# Patient Record
Sex: Male | Born: 1960
Health system: Southern US, Community
[De-identification: ages and names within clinical notes are randomized; demographics above are authoritative.]

## PROBLEM LIST (undated history)

## (undated) DIAGNOSIS — M1990 Unspecified osteoarthritis, unspecified site: Secondary | ICD-10-CM

## (undated) DIAGNOSIS — F1910 Other psychoactive substance abuse, uncomplicated: Secondary | ICD-10-CM

## (undated) DIAGNOSIS — F411 Generalized anxiety disorder: Secondary | ICD-10-CM

## (undated) DIAGNOSIS — F17200 Nicotine dependence, unspecified, uncomplicated: Secondary | ICD-10-CM

## (undated) DIAGNOSIS — F1110 Opioid abuse, uncomplicated: Secondary | ICD-10-CM

## (undated) DIAGNOSIS — L409 Psoriasis, unspecified: Secondary | ICD-10-CM

## (undated) DIAGNOSIS — Z87442 Personal history of urinary calculi: Secondary | ICD-10-CM

## (undated) DIAGNOSIS — G629 Polyneuropathy, unspecified: Secondary | ICD-10-CM

## (undated) DIAGNOSIS — R892 Abnormal level of other drugs, medicaments and biological substances in specimens from other organs, systems and tissues: Secondary | ICD-10-CM

## (undated) DIAGNOSIS — T7840XA Allergy, unspecified, initial encounter: Secondary | ICD-10-CM

## (undated) HISTORY — DX: Generalized anxiety disorder: F41.1

## (undated) HISTORY — DX: Allergy, unspecified, initial encounter: T78.40XA

## (undated) HISTORY — DX: Nicotine dependence, unspecified, uncomplicated: F17.200

## (undated) HISTORY — DX: Abnormal level of other drugs, medicaments and biological substances in specimens from other organs, systems and tissues: R89.2

## (undated) HISTORY — DX: Unspecified osteoarthritis, unspecified site: M19.90

## (undated) HISTORY — DX: Other psychoactive substance abuse, uncomplicated: F19.10

## (undated) HISTORY — DX: Personal history of urinary calculi: Z87.442

## (undated) HISTORY — DX: Psoriasis, unspecified: L40.9

## (undated) HISTORY — DX: Opioid abuse, uncomplicated: F11.10

---

## 1998-06-18 DIAGNOSIS — F17200 Nicotine dependence, unspecified, uncomplicated: Secondary | ICD-10-CM

## 1998-06-18 HISTORY — DX: Nicotine dependence, unspecified, uncomplicated: F17.200

## 2002-09-17 ENCOUNTER — Emergency Department (HOSPITAL_COMMUNITY): Admission: EM | Admit: 2002-09-17 | Discharge: 2002-09-17 | Payer: Self-pay | Admitting: Emergency Medicine

## 2002-09-17 ENCOUNTER — Encounter: Payer: Self-pay | Admitting: Internal Medicine

## 2002-09-21 ENCOUNTER — Ambulatory Visit (HOSPITAL_BASED_OUTPATIENT_CLINIC_OR_DEPARTMENT_OTHER): Admission: RE | Admit: 2002-09-21 | Discharge: 2002-09-21 | Payer: Self-pay | Admitting: Urology

## 2002-09-21 ENCOUNTER — Encounter: Payer: Self-pay | Admitting: Urology

## 2004-04-18 HISTORY — PX: SHOULDER SURGERY: SHX246

## 2004-05-08 ENCOUNTER — Ambulatory Visit: Payer: Self-pay | Admitting: Family Medicine

## 2004-06-18 HISTORY — PX: KIDNEY STONE SURGERY: SHX686

## 2008-06-18 HISTORY — PX: KNEE SURGERY: SHX244

## 2008-09-14 ENCOUNTER — Emergency Department (HOSPITAL_COMMUNITY): Admission: EM | Admit: 2008-09-14 | Discharge: 2008-09-14 | Payer: Self-pay | Admitting: Emergency Medicine

## 2009-04-01 ENCOUNTER — Ambulatory Visit: Payer: Self-pay | Admitting: Unknown Physician Specialty

## 2009-04-04 ENCOUNTER — Ambulatory Visit: Payer: Self-pay | Admitting: Unknown Physician Specialty

## 2010-09-28 LAB — COMPREHENSIVE METABOLIC PANEL
AST: 13 U/L (ref 0–37)
Albumin: 4.2 g/dL (ref 3.5–5.2)
Alkaline Phosphatase: 71 U/L (ref 39–117)
BUN: 11 mg/dL (ref 6–23)
CO2: 33 mEq/L — ABNORMAL HIGH (ref 19–32)
Chloride: 103 mEq/L (ref 96–112)
GFR calc Af Amer: >60 mL/min (ref >60)
GFR calc non Af Amer: >60 mL/min (ref >60)
Potassium: 4.5 mEq/L (ref 3.5–5.1)
Total Bilirubin: 0.7 mg/dL (ref 0.3–1.2)

## 2010-09-28 LAB — URINALYSIS, ROUTINE W REFLEX MICROSCOPIC
Nitrite: NEGATIVE
Specific Gravity, Urine: 1.017 (ref 1.005–1.030)
Urobilinogen, UA: 1 mg/dL (ref 0.0–1.0)

## 2010-09-28 LAB — DIFFERENTIAL
Basophils Absolute: 0.1 10*3/uL (ref 0.0–0.1)
Lymphocytes Relative: 45 % (ref 12–46)
Lymphs Abs: 3 10*3/uL (ref 0.7–4.0)
Neutro Abs: 3 10*3/uL (ref 1.7–7.7)

## 2010-09-28 LAB — CBC
HCT: 46.9 % (ref 39.0–52.0)
Platelets: 244 10*3/uL (ref 150–400)
RBC: 4.95 MIL/uL (ref 4.22–5.81)
WBC: 6.7 10*3/uL (ref 4.0–10.5)

## 2010-09-28 LAB — LIPASE, BLOOD: Lipase: 33 U/L (ref 11–59)

## 2010-11-03 NOTE — Op Note (Signed)
Jason Rodgers, Jason Rodgers                             ACCOUNT NO.:  [id number]   MEDICAL RECORD NO.:  [id number]                   PATIENT TYPE:  AMB   LOCATION:  [facility]                                 FACILITY:  [facility]   PHYSICIAN:  Mark C. Vernie Ammons, M.D.               DATE OF BIRTH:  1960/10/20   DATE OF PROCEDURE:  09/21/2002  DATE OF DISCHARGE:                                 OPERATIVE REPORT   PREOPERATIVE DIAGNOSIS:  Left ureteral calculus.   POSTOPERATIVE DIAGNOSIS:  Left ureteral calculus.   PROCEDURE:  Cystoscopy, left retrograde pyelogram with interpretation, left  ureteroscopy with in situ laser lithotripsy and stone extraction with double  J stent placement.   SURGEON:  Mark C. Vernie Ammons, M.D.   ANESTHESIA:  General.   ESTIMATED BLOOD LOSS:  Less than 5 mL.   DRAINS:  4.5 French 24 cm double J stent in the left ureter.   SPECIMENS:  Stone to patient.   COMPLICATIONS:  None.   INDICATIONS FOR PROCEDURE:  The patient is a 50 year old white male who  presented to my office on September 17, 2002 with severe left flank pain and  gross hematuria. A CT scan revealed a stone in the left ureter that measured  3 x 5 mm. He wanted to give it a little bit of time to try to pass but it  did not and he therefore elected to proceed with ureteroscopic extraction of  the stone. The risks and complications were discussed.   DESCRIPTION OF PROCEDURE:  After informed consent, the patient was brought  to the major OR, placed on the table, administered general anesthesia. A KUB  done today revealed the stone approximately 2-3 cm below where it was before  but still in the mid to lower ureter. The 21 French cystoscope was then used  to perform cystoscopy. The urethra was normal down to the sphincter. The  prostatic urethra was unobstructing without lesion. The bladder was free of  any tumor, stones or inflammatory lesions.   The left ureteral orifice was identified and a 0.038 inch floppy tip  guidewire was then passed up to the left ureter under direct fluoroscopic  control. There was some hold up in the area of the stone but was able to get  it past the stone and into the area of the kidney. I then used the 4 cm  length 15 French ureteral dilating balloon to dilate the balloon to 11  atmospheres for approximately 1 minute. I then removed the dilating balloon  and left the guidewire in place and passed the ureteroscope next to the  guidewire up the ureter and then visualized the stone. It seemed somewhat  impacted.  I therefore used the laser to fragment the stone. The stone  fragments then were grasped with the nitinol basket and extracted.  Reinspection revealed no further stone fragments present. The retrograde  pyelogram  revealed no stones proximally or filling defects. I therefore  removed the ureteroscope and back loaded the cystoscope over the guidewire.  The double J stent was passed over the guidewire, the guidewire was removed,  good coil was noted in the renal pelvis and in the bladder. A string was  left affixed to the stent and the bladder was  drained. The patient received a B&O suppository and was awakened and taken  to the recovery room in stable satisfactory condition. He tolerated the  procedure well with no intraoperative complications. He will be given a  prescription for Pyridium plus and return to my office the latter part of  the week for his stent removal.                                               Mark C. Vernie Ammons, M.D.    MCO/MEDQ  D:  09/21/2002  T:  09/21/2002  Job:  [id number]

## 2011-04-03 ENCOUNTER — Ambulatory Visit: Payer: Self-pay | Admitting: Internal Medicine

## 2011-05-14 ENCOUNTER — Ambulatory Visit: Payer: Self-pay | Admitting: Internal Medicine

## 2013-02-06 ENCOUNTER — Encounter (HOSPITAL_COMMUNITY): Payer: Self-pay | Admitting: *Deleted

## 2013-02-06 ENCOUNTER — Emergency Department (HOSPITAL_COMMUNITY)
Admission: EM | Admit: 2013-02-06 | Discharge: 2013-02-06 | Disposition: A | Discharge status: Home / Self Care | Payer: Medicare HMO | Attending: Emergency Medicine | Admitting: Emergency Medicine

## 2013-02-06 DIAGNOSIS — Z87442 Personal history of urinary calculi: Secondary | ICD-10-CM | POA: Insufficient documentation

## 2013-02-06 DIAGNOSIS — M25569 Pain in unspecified knee: Secondary | ICD-10-CM | POA: Insufficient documentation

## 2013-02-06 DIAGNOSIS — F17200 Nicotine dependence, unspecified, uncomplicated: Secondary | ICD-10-CM | POA: Insufficient documentation

## 2013-02-06 DIAGNOSIS — G8929 Other chronic pain: Secondary | ICD-10-CM | POA: Insufficient documentation

## 2013-02-06 DIAGNOSIS — Z8739 Personal history of other diseases of the musculoskeletal system and connective tissue: Secondary | ICD-10-CM | POA: Insufficient documentation

## 2013-02-06 MED ORDER — CELECOXIB 200 MG PO CAPS: 200.0000 mg | ORAL_CAPSULE | Freq: Two times a day (BID) | ORAL | Status: DC

## 2013-02-06 MED ORDER — OXYCODONE-ACETAMINOPHEN 5-325 MG PO TABS: 1.0000 | ORAL_TABLET | ORAL | Status: DC | PRN

## 2013-02-06 NOTE — ED Provider Notes (Signed)
CSN: [id number]     Arrival date & time 02/06/13  7829 History     First MD Initiated Contact with Patient 02/06/13 [phone]     Chief Complaint  Patient presents with  . Leg Pain   (Consider location/radiation/quality/duration/timing/severity/associated sxs/prior Treatment) HPI Patient is a 52 year old male who came into the emergency department today complaining of a chronic bilateral knee pain. Patient states he has had pain for "many years." States had surgery on his left knee where they "cleaned the cartilage out." Patient states he has severe arthritis in bilateral knees and states he has "cartilage left." He is followed by a physician and had surgery at [facility]. States just lost his primary care doctor who has moved in does not have a followup any longer. States his doctor had him on Celebrex, Xanax, oxycodone 30 mg. States he is out of those medications. States his pain in his knees and is worsening. Patient states he is unable to do ice or heat because it makes his pain worse. She states he has pain with walking, moving his knees, and unable to sleep due to pain. Patient states pain radiates to bilateral feet. Patient denies any swelling of the lower extremities or knee joints. No fever, chills, malaise.  denies any new injury Past Medical History  Diagnosis Date  . Kidney stones    Past Surgical History  Procedure Laterality Date  . Knee surgery     No family history on file. History  Substance Use Topics  . Smoking status: Current Every Day Smoker -- 0.50 packs/day    Types: Cigarettes  . Smokeless tobacco: Not on file  . Alcohol Use: No    Review of Systems  Constitutional: Negative for fever and chills.  HENT: Negative for neck pain and neck stiffness.   Respiratory: Negative for cough, chest tightness and shortness of breath.   Cardiovascular: Negative for chest pain, palpitations and leg swelling.  Genitourinary: Negative for urgency.  Musculoskeletal:  Positive for myalgias and arthralgias. Negative for joint swelling.  Skin: Negative for rash.  Allergic/Immunologic: Negative for immunocompromised state.  Neurological: Negative for dizziness, weakness, light-headedness, numbness and headaches.    Allergies  Review of patient's allergies indicates no known allergies.  Home Medications  No current outpatient prescriptions on file. BP 142/91  Pulse 84  Temp(Src) 99.5 F (37.5 C)  Resp 20  SpO2 100% Physical Exam  Nursing note and vitals reviewed. Constitutional: He appears well-developed and well-nourished. No distress.  Cardiovascular: Normal rate, regular rhythm and normal heart sounds.   Pulmonary/Chest: Effort normal and breath sounds normal. No respiratory distress. He has no wheezes. He has no rales.  Musculoskeletal:  Normal appearing bilateral knees or legs. There is no swelling, erythema, rash, edema. Full range of motion of bilateral ankles and knee joints. The joints are diffusely tender. Pain with range of motion. Joints are stable with no exudate with medial lateral stress negative anterior/posterior drawer signs  Neurological: He is alert.  Skin: Skin is warm and dry. No rash noted. No erythema.    ED Course   Procedures (including critical care time)  Labs Reviewed - No data to display No results found.   1. Knee pain, chronic, unspecified laterality     MDM  Patient bilateral chronic knee pain for many years. Knees do not appear infected on exam, there is no swelling to the knee joint or of lower extremities. Doubt DVT doubt acute injury. I do not think any imaging is indicated at  this time given patient has not had any injuries to the legs. Patient is asking for her from toe primary care doctor as well as an orthopedic specialist. Patient states that he did talk to him about possible joint replacement in the future and he wants to pursue it at this time. Will refill patient's Celebrex and give him few percocets  for severe pain, until he is able to follow up.  Filed Vitals:   02/06/13 0703  BP: 142/91  Pulse: 84  Temp: 99.5 F (37.5 C)  Resp: 20  SpO2: 100%     Lottie Mussel, PA-C 02/06/13 [phone]

## 2013-02-06 NOTE — ED Notes (Signed)
Pt c/o bilateral leg pain; knee caps to feet; previous history of psoriasis/arthritis/no knee cartilage

## 2013-02-06 NOTE — ED Notes (Signed)
Pt c/o bilateral knee pain, previous left knee surgery. Is able to ambulate but with pain. Was prescribed 30mg  oxycodone, recently ran out of rx.

## 2013-02-07 NOTE — ED Provider Notes (Signed)
Medical screening examination/treatment/procedure(s) were performed by non-physician practitioner and as supervising physician I was immediately available for consultation/collaboration.  Olivia Mackie, MD 02/07/13 [phone]

## 2013-04-10 ENCOUNTER — Ambulatory Visit: Payer: Self-pay | Admitting: Unknown Physician Specialty

## 2013-08-03 ENCOUNTER — Ambulatory Visit (INDEPENDENT_AMBULATORY_CARE_PROVIDER_SITE_OTHER): Payer: Medicare HMO | Admitting: Family Medicine

## 2013-08-03 ENCOUNTER — Encounter: Payer: Self-pay | Admitting: Family Medicine

## 2013-08-03 VITALS — BP 116/72 | HR 120 | Temp 98.1°F | Ht 65.5 in | Wt 122.5 lb

## 2013-08-03 DIAGNOSIS — M25562 Pain in left knee: Secondary | ICD-10-CM

## 2013-08-03 DIAGNOSIS — M25561 Pain in right knee: Secondary | ICD-10-CM | POA: Insufficient documentation

## 2013-08-03 DIAGNOSIS — M25569 Pain in unspecified knee: Secondary | ICD-10-CM

## 2013-08-03 DIAGNOSIS — F411 Generalized anxiety disorder: Secondary | ICD-10-CM | POA: Insufficient documentation

## 2013-08-03 MED ORDER — SERTRALINE HCL 25 MG PO TABS: 25.0000 mg | ORAL_TABLET | Freq: Every day | ORAL | Status: DC

## 2013-08-03 MED ORDER — OXYCODONE HCL ER 10 MG PO T12A: 10.0000 mg | EXTENDED_RELEASE_TABLET | Freq: Three times a day (TID) | ORAL | Status: DC

## 2013-08-03 NOTE — Progress Notes (Signed)
   Subjective:   Patient ID: Jason Rodgers, male    DOB: [birth year], 53 y.o.   MRN: [id number]  Jason RiisRandy W Rodgers is a pleasant 53 y.o. year old male who presents to clinic today with Establish Care and Knee Pain  on 08/03/2013  HPI: Left knee pain- chronic issue, followed by Dr. Erin SonsHarold Kernodle.  Per pt, h/o meniscal tear and was told that he could have surgery but then Dr. Gavin PottersKernodle changed his mind.  He wants a second opinion.  Darl PikesSusan fuller was giving him rx for oxycontin 10 mg three times daily!  Asks for a refill today.  On disability s/p fall from ladder that shattered his left shoulder.  GAD- takes xanax 2 mg three times daily.  Has never been placed on an SSRI. Denies depression Denies SI or HI. Patient Active Problem List   Diagnosis Date Noted  . Left knee pain 08/03/2013  . Generalized anxiety disorder 08/03/2013   Past Medical History  Diagnosis Date  . Kidney stones   . Arthritis   . Psoriasis    Past Surgical History  Procedure Laterality Date  . Knee surgery    . Kidney stone surgery     History  Substance Use Topics  . Smoking status: Current Every Day Smoker -- 0.50 packs/day    Types: Cigarettes  . Smokeless tobacco: Never Used  . Alcohol Use: No   Family History  Problem Relation Age of Onset  . Arthritis Mother   . Hypertension Mother   . Arthritis Father   . Heart disease Father   . Arthritis Paternal Grandmother    No Known Allergies Current Outpatient Prescriptions on File Prior to Visit  Medication Sig Dispense Refill  . alprazolam (XANAX) 2 MG tablet Take 2 mg by mouth 3 (three) times daily as needed for anxiety.      . celecoxib (CELEBREX) 200 MG capsule Take 1 capsule (200 mg total) by mouth 2 (two) times daily.  30 capsule  0  . Clobetasol Propionate (CLOBEX EX) Apply 1 application topically daily.      Marland Kitchen. ibuprofen (ADVIL,MOTRIN) 800 MG tablet Take 800 mg by mouth every 8 (eight) hours as needed for pain.       No current facility-administered  medications on file prior to visit.   The PMH, PSH, Social History, Family History, Medications, and allergies have been [facility], and have been updated if relevant.   Review of Systems See HPI + radiulopathy LE    Objective:    BP 116/72  Pulse 120  Temp(Src) 98.1 F (36.7 C) (Oral)  Ht 5' 5.5" (1.664 m)  Wt 122 lb 8 oz (55.566 kg)  BMI 20.07 kg/m2  SpO2 96%   Physical Exam  Nursing note and vitals reviewed. Constitutional: He is oriented to person, place, and time. He appears well-developed and well-nourished. No distress.  HENT:  Head: Normocephalic.  Musculoskeletal: Normal range of motion.  Neurological: He is alert and oriented to person, place, and time. He has normal reflexes.  Skin: Skin is warm and dry.  Psychiatric: He has a normal mood and affect. His behavior is normal. Judgment and thought content normal.          Assessment & Plan:   Left knee pain - Plan: AMB referral to orthopedics  Generalized anxiety disorder Return in about 1 month (around 08/31/2013).

## 2013-08-03 NOTE — Assessment & Plan Note (Signed)
Poorly controlled and not properly managed.  On high dose benzo only. Start zoloft 25 mg daily. Follow up in 1 month- I would like to wean him off of this. The patient indicates understanding of these issues and agrees with the plan.

## 2013-08-03 NOTE — Progress Notes (Signed)
Pre-visit discussion using our clinic review tool. No additional management support is needed unless otherwise documented below in the visit note.  

## 2013-08-03 NOTE — Patient Instructions (Addendum)
Nice to meet you. Please stop by to see Shirlee LimerickMarion on your way out to set up your ortho referral.  We are starting zoloft 25 mg daily. Please come see me in 1 month.

## 2013-08-03 NOTE — Assessment & Plan Note (Addendum)
Deteriorated.  Will request records- refer for second opinion. Explained to Mr. Jason Rodgers that I do not manage chronic pain with narcotics.  He will need to either get this from ortho or from pain clinic.  I looked him up in the [place] database and I am little concerned that he has received narcotics from several provider- per pt, his doctor retired and saw the other physicians once only. I am concerned that he may be drug seeking. Will give 30 tablets (10 day supply).  If he has not seen ortho within 10 days, I will refill until he can get an appointment with ortho. The patient indicates understanding of these issues and agrees with the plan.

## 2013-08-05 ENCOUNTER — Telehealth: Payer: Self-pay | Admitting: Family Medicine

## 2013-08-05 NOTE — Telephone Encounter (Signed)
Relevant patient education mailed to patient.  

## 2013-08-10 ENCOUNTER — Telehealth: Payer: Self-pay

## 2013-08-10 NOTE — Telephone Encounter (Signed)
Spoke to pt and advised per Dr Dayton MartesAron. Pt states that he has an appt with [facility] on 08/12/13, which I told him to be sure to attend as his meds will run out on Wednesday.

## 2013-08-10 NOTE — Telephone Encounter (Signed)
[facility] [employer] [facility] [employer] [facility] that oxycontin 10 mg was not covered under pts 2015 prescription drug plan; pt request substitution of med. Pt has taken oxycodone HCL 30 mg taking 3 a day and oxycodone HCL 10 mg taking 4 a day.Pt will need rx for med by 08/12/13. Pt has not been able to see knee doctor yet.pt request cb.

## 2013-08-10 NOTE — Telephone Encounter (Signed)
I will no longer be prescribing his pain medication.  We have been unable to get him in anywhere (ortho) and I would be happy to talk to him about this over the phone and he can speak with Shirlee LimerickMarion.  I would also be happy to refer him to a pain clinic but I am not going to manage his oxycodone from now on.  If this is a problem, I suggest he find another primary care doctor.

## 2013-08-10 NOTE — Telephone Encounter (Signed)
Thank you for speaking with him and conveying this message.  Yes he needs to keep this appointment.

## 2013-08-12 ENCOUNTER — Ambulatory Visit: Payer: Self-pay | Admitting: Pain Medicine

## 2013-08-25 ENCOUNTER — Encounter: Payer: Self-pay | Admitting: Family Medicine

## 2013-08-25 ENCOUNTER — Telehealth: Payer: Self-pay | Admitting: Family Medicine

## 2013-08-25 ENCOUNTER — Ambulatory Visit (INDEPENDENT_AMBULATORY_CARE_PROVIDER_SITE_OTHER): Payer: Medicare HMO | Admitting: Family Medicine

## 2013-08-25 VITALS — BP 114/70 | HR 97 | Temp 98.0°F | Wt 123.5 lb

## 2013-08-25 DIAGNOSIS — M25569 Pain in unspecified knee: Secondary | ICD-10-CM

## 2013-08-25 DIAGNOSIS — M25562 Pain in left knee: Secondary | ICD-10-CM

## 2013-08-25 DIAGNOSIS — F411 Generalized anxiety disorder: Secondary | ICD-10-CM

## 2013-08-25 MED ORDER — ALPRAZOLAM 2 MG PO TABS: 2.0000 mg | ORAL_TABLET | Freq: Three times a day (TID) | ORAL | Status: DC | PRN

## 2013-08-25 MED ORDER — SERTRALINE HCL 50 MG PO TABS: 50.0000 mg | ORAL_TABLET | Freq: Every day | ORAL | Status: DC

## 2013-08-25 NOTE — Patient Instructions (Signed)
Good to see you. We are increasing your zoloft to 50 mg daily. I refilled your xanax today but I would like to wean you off of this.

## 2013-08-25 NOTE — Assessment & Plan Note (Signed)
Remains poorly controlled. Increase zoloft to 50 mg daily. Rx for xanax refilled today but I do plan to wean him off of this.

## 2013-08-25 NOTE — Progress Notes (Signed)
Pre visit review using our clinic review tool, if applicable. No additional management support is needed unless otherwise documented below in the visit note. 

## 2013-08-25 NOTE — Telephone Encounter (Signed)
Relevant patient education mailed to patient.  

## 2013-08-25 NOTE — Assessment & Plan Note (Signed)
I refused again to refill his narcotics today. He does appear to be withdrawing- vital signs are stable.

## 2013-08-25 NOTE — Progress Notes (Signed)
Subjective:   Patient ID: Jason Rodgers, male    DOB: [birth year], 53 y.o.   MRN: [id number]  Jason Rodgers is a pleasant 53 y.o. year old male who presents to clinic today with Follow-up  on 08/25/2013  HPI: Left knee pain- chronic issue, followed by Dr. Erin SonsHarold Kernodle.  Per pt, h/o meniscal tear and was told that he could have surgery but then Dr. Gavin PottersKernodle changed his mind.  He wants a second opinion.  Darl PikesSusan fuller was giving him rx for oxycontin 10 mg three times daily!  Referred to ortho for second opinion (he saw them on Friday) and he has appt with pain management.  I told him I would not refill his narcotics but he is asking for them again today.  On disability s/p fall from ladder that shattered his left shoulder.  GAD- takes xanax 2 mg three times daily.  Has never been placed on an SSRI so I started him on zoloft 25 mg daily last month.  He has noticed no improvement. Denies depression Denies SI or HI. Patient Active Problem List   Diagnosis Date Noted  . Left knee pain 08/03/2013  . Generalized anxiety disorder 08/03/2013   Past Medical History  Diagnosis Date  . Kidney stones   . Arthritis   . Psoriasis    Past Surgical History  Procedure Laterality Date  . Knee surgery    . Kidney stone surgery     History  Substance Use Topics  . Smoking status: Current Every Day Smoker -- 0.50 packs/day    Types: Cigarettes  . Smokeless tobacco: Never Used  . Alcohol Use: No   Family History  Problem Relation Age of Onset  . Arthritis Mother   . Hypertension Mother   . Arthritis Father   . Heart disease Father   . Arthritis Paternal Grandmother    No Known Allergies Current Outpatient Prescriptions on File Prior to Visit  Medication Sig Dispense Refill  . celecoxib (CELEBREX) 200 MG capsule Take 1 capsule (200 mg total) by mouth 2 (two) times daily.  30 capsule  0  . Clobetasol Propionate (CLOBEX EX) Apply 1 application topically daily.      Marland Kitchen. ibuprofen (ADVIL,MOTRIN) 800  MG tablet Take 800 mg by mouth every 8 (eight) hours as needed for pain.      . OxyCODONE (OXYCONTIN) 10 mg T12A 12 hr tablet Take 1 tablet (10 mg total) by mouth every 8 (eight) hours.  30 tablet  0   No current facility-administered medications on file prior to visit.   The PMH, PSH, Social History, Family History, Medications, and allergies have been reviewed [facility], and have been updated if relevant.   Review of Systems See HPI + radiulopathy LE    Objective:    BP 114/70  Pulse 97  Temp(Src) 98 F (36.7 C) (Oral)  Wt 123 lb 8 oz (56.019 kg)  SpO2 97%   Physical Exam  Nursing note and vitals reviewed. Constitutional: He is oriented to person, place, and time. He appears well-developed and well-nourished. No distress.  HENT:  Head: Normocephalic.  Musculoskeletal: Normal range of motion.  Neurological: He is alert and oriented to person, place, and time. He has normal reflexes.  Skin: Skin is warm and dry.  Psychiatric: He has a normal mood and affect. His behavior is normal. Judgment and thought content normal.  Does appear to be going through withdrawal symptoms today        Assessment &  Plan:   Generalized anxiety disorder No Follow-up on file.

## 2013-08-31 ENCOUNTER — Ambulatory Visit: Payer: Medicare HMO | Admitting: Family Medicine

## 2013-12-14 LAB — COMPREHENSIVE METABOLIC PANEL
ALK PHOS: 62 U/L
ALT: 9
AST: 14 U/L
BILIRUBIN: 0.3
CREATININE: 1.02
Glucose: 93
LDH: 137 U/L
Potassium: 3.9 mmol/L
Sodium: 143 mmol/L (ref 137–147)
TOTAL PROTEIN: 6.8 g/dL
Uric Acid: 3.3

## 2013-12-14 LAB — LIPID PANEL
Cholesterol, Total: 143
HDL: 46 mg/dL (ref 35–70)
Triglycerides: 74

## 2013-12-14 LAB — TSH: TSH: 2.62

## 2013-12-14 LAB — VITAMIN D 25 HYDROXY (VIT D DEFICIENCY, FRACTURES): VIT D 25 HYDROXY: 34.8

## 2013-12-14 LAB — CBC
HEMOGLOBIN: 14.9 g/dL
PLATELET COUNT: 265
WBC: 5.9

## 2014-08-25 ENCOUNTER — Emergency Department: Payer: Self-pay | Admitting: Emergency Medicine

## 2014-08-25 DIAGNOSIS — G600 Hereditary motor and sensory neuropathy: Secondary | ICD-10-CM | POA: Diagnosis not present

## 2014-08-25 DIAGNOSIS — M79604 Pain in right leg: Secondary | ICD-10-CM | POA: Diagnosis not present

## 2014-08-25 DIAGNOSIS — R197 Diarrhea, unspecified: Secondary | ICD-10-CM | POA: Diagnosis not present

## 2014-08-25 DIAGNOSIS — R531 Weakness: Secondary | ICD-10-CM | POA: Diagnosis not present

## 2014-08-25 DIAGNOSIS — Z720 Tobacco use: Secondary | ICD-10-CM | POA: Diagnosis not present

## 2014-08-25 DIAGNOSIS — M79605 Pain in left leg: Secondary | ICD-10-CM | POA: Diagnosis not present

## 2014-08-25 DIAGNOSIS — R42 Dizziness and giddiness: Secondary | ICD-10-CM | POA: Diagnosis not present

## 2014-08-25 DIAGNOSIS — G629 Polyneuropathy, unspecified: Secondary | ICD-10-CM | POA: Diagnosis not present

## 2014-08-30 ENCOUNTER — Telehealth: Payer: Self-pay | Admitting: Family Medicine

## 2014-08-30 NOTE — Telephone Encounter (Signed)
Pt mother saw Dr. Sharen HonesGutierrez today and at check out made her son an new pt appt with Dr. Sharen HonesGutierrez. After scheduling appt, I saw that he is a current pt of Dr. Dayton MartesAron. I explained that to his mother Lurena JoinerRebecca. Lurena JoinerRebecca states he does want to switch from Dr. Dayton MartesAron to Dr. Sharen HonesGutierrez. He will need pain mgt referral and rheumatology referral.  Will this switch be ok?

## 2014-08-30 NOTE — Telephone Encounter (Signed)
Ok with me.  I have only seen him twice.

## 2014-08-31 NOTE — Telephone Encounter (Addendum)
Ok to switch if pt desires switch. Spoke with mother and brother today.

## 2014-08-31 NOTE — Telephone Encounter (Signed)
Thanks, Pt aware of appt on 3/28. He is contacting [employer] to change pcp's for insurance purposes.  PCP changed 08/31/14

## 2014-09-13 ENCOUNTER — Ambulatory Visit (INDEPENDENT_AMBULATORY_CARE_PROVIDER_SITE_OTHER): Payer: Commercial Managed Care - HMO | Admitting: Family Medicine

## 2014-09-13 ENCOUNTER — Encounter: Payer: Self-pay | Admitting: Family Medicine

## 2014-09-13 VITALS — BP 120/82 | HR 80 | Temp 98.2°F | Ht 67.25 in | Wt 117.8 lb

## 2014-09-13 DIAGNOSIS — F17200 Nicotine dependence, unspecified, uncomplicated: Secondary | ICD-10-CM | POA: Insufficient documentation

## 2014-09-13 DIAGNOSIS — L409 Psoriasis, unspecified: Secondary | ICD-10-CM

## 2014-09-13 DIAGNOSIS — R202 Paresthesia of skin: Secondary | ICD-10-CM

## 2014-09-13 DIAGNOSIS — Z13220 Encounter for screening for lipoid disorders: Secondary | ICD-10-CM

## 2014-09-13 DIAGNOSIS — Z720 Tobacco use: Secondary | ICD-10-CM

## 2014-09-13 DIAGNOSIS — M79641 Pain in right hand: Secondary | ICD-10-CM | POA: Insufficient documentation

## 2014-09-13 DIAGNOSIS — M79606 Pain in leg, unspecified: Secondary | ICD-10-CM

## 2014-09-13 DIAGNOSIS — F411 Generalized anxiety disorder: Secondary | ICD-10-CM

## 2014-09-13 NOTE — Progress Notes (Addendum)
BP 120/82 mmHg  Pulse 80  Temp(Src) 98.2 F (36.8 C)  Ht 5' 7.25" (1.708 m)  Wt 117 lb 12.8 oz (53.434 kg)  BMI 18.32 kg/m2   CC: transfer from Dr Deborra Medina  Subjective:    Patient ID: Jason Rodgers, male    DOB: 11-Jul-1960, 54 y.o.   MRN: [id number]  HPI: Jason Rodgers is a 54 y.o. male presenting on 09/13/2014 for Establish Care   Pt seen today transfer care, prior saw Dr Deborra Medina. I see family members Gaylyn Lambert). H/o anxiety on xanax as well as diffuse arthritis s/p knee surgeries prior on narcotics. Brings CDs with knee xrays. Family was concerned about controlled substance overuse.  Chronic joint pains - bilateral knees with radiation down legs to feet and ankles. Worries about psoriatic arthritis. Endorses hand pains as well. No elbow pain, back or neck pain. No fevers/chills or new rashes. Some numbness L knee at popliteal region and some burning pain.  lyrica caused itchy rash. Gabapentin caused diarrhea. celebrex ineffective.  Smoker - 1/2 ppd x 15 yrs.   Disability - for L shoulder injury s/p surgery. 2006  GAD - sertraline and amitriptyline were ineffective. Klonopin was helpful.   Psoriasis - somewhat controlled with clobetasol. Has been on MTX in past - Dr Roselie Awkward. Has seen dermatologist remotely.   Last ate lunch at 11:30am.   Has been seeing Delia Chimes. Latest asked for referral to ortho or rheum, was not satisfied with wait. Last had xanax prescribed by Delia Chimes.   Preventative: No recent CPE   Relevant past medical, surgical, family and social history reviewed and updated as indicated. Interim medical history since our last visit reviewed. Allergies and medications reviewed and updated. Current Outpatient Prescriptions on File Prior to Visit  Medication Sig  . Clobetasol Propionate (CLOBEX EX) Apply 1 application topically daily.   No current facility-administered medications on file prior to visit.    Review of Systems Per HPI unless specifically  indicated above     Objective:    BP 120/82 mmHg  Pulse 80  Temp(Src) 98.2 F (36.8 C)  Ht 5' 7.25" (1.708 m)  Wt 117 lb 12.8 oz (53.434 kg)  BMI 18.32 kg/m2  Wt Readings from Last 3 Encounters:  09/13/14 117 lb 12.8 oz (53.434 kg)  08/25/13 123 lb 8 oz (56.019 kg)  08/03/13 122 lb 8 oz (55.566 kg)    Physical Exam  Constitutional: He appears well-developed and well-nourished. No distress.  thin  HENT:  Mouth/Throat: Oropharynx is clear and moist. No oropharyngeal exudate.  Eyes: Conjunctivae and EOM are normal. Pupils are equal, round, and reactive to light. No scleral icterus.  Neck: Normal range of motion. Neck supple. No thyromegaly present.  Cardiovascular: Normal rate, regular rhythm, normal heart sounds and intact distal pulses.   No murmur heard. Pulmonary/Chest: Effort normal and breath sounds normal. No respiratory distress. He has no wheezes. He has no rales.  Musculoskeletal: He exhibits no edema.  2+ DP bilaterally Tender to palpation at knees, ankles but no evident edema/effusion or active synovitis Pitting of fingernails  Lymphadenopathy:    He has no cervical adenopathy.  Skin: Skin is warm and dry. Rash (psoriatic scaly plaque rash hands and feet) noted.  Psychiatric: He has a normal mood and affect.  Nursing note and vitals reviewed.     Assessment & Plan:   Problem List Items Addressed This Visit    Smoker    Will continue to encourage smoking cessation.  precontemplative currently.      Psoriasis    Pt uses clobetasol prn psoriatic rash.      Relevant Orders   Ambulatory referral to Rheumatology   Pain in limb - Primary    Predominantly affecting bilateral lower extremities but also some hand arthritis present as well.  Some concern for psoriatic arthritis.  Will check for systemic inflammatory arthritis with ESR, RF, ANA, CCP and HLA B27. Will also check CBC, CMP today. Given some neuropathic description of sxs will also check TSH and  B12. Will refer to rheum per patient request for further evaluation of autoimmune arthritis.  ADDENDUM ==> unable to add RF, ANA, CCP or HLA B27. Will see if pt wants to return for this prior to rheum appt.      Relevant Orders   Comprehensive metabolic panel (Completed)   CBC with Differential/Platelet (Completed)   Sedimentation rate (Completed)   Ambulatory referral to Rheumatology   Generalized anxiety disorder    Pt has recently decreased dose from $RemoveB'2mg'zNNdjAoP$  TID to $Remo'1mg'mwVCV$  QID. Discussed long term xanax is not a good strategy for anxiety control. Discussed possible change to longer acting klonopin which pt felt helpful in the past. Discussed antidepressant - but pt has not responded to several in past and is hesitant to re-trial. Will reassess next visit.       Other Visit Diagnoses    Screening for lipoid disorders        Relevant Orders    Lipid panel (Completed)    Comprehensive metabolic panel (Completed)    Paresthesias        Relevant Orders    TSH (Completed)    CBC with Differential/Platelet (Completed)    Vitamin B12 (Completed)        Follow up plan: Return in about 3 months (around 12/14/2014).

## 2014-09-13 NOTE — Progress Notes (Signed)
Pre visit review using our clinic review tool, if applicable. No additional management support is needed unless otherwise documented below in the visit note. 

## 2014-09-13 NOTE — Patient Instructions (Addendum)
Sign release for records up front - [facility]. We will refer you to rheumatologist.  Return to see me after appt with rheumatology.  Blood work today.

## 2014-09-14 DIAGNOSIS — L409 Psoriasis, unspecified: Secondary | ICD-10-CM | POA: Insufficient documentation

## 2014-09-14 LAB — CBC WITH DIFFERENTIAL/PLATELET
Basophils Absolute: 0.1 10*3/uL (ref 0.0–0.1)
Basophils Relative: 1.1 % (ref 0.0–3.0)
EOS PCT: 2 % (ref 0.0–5.0)
Eosinophils Absolute: 0.1 10*3/uL (ref 0.0–0.7)
HCT: 40 % (ref 39.0–52.0)
HEMOGLOBIN: 13.4 g/dL (ref 13.0–17.0)
LYMPHS ABS: 2.5 10*3/uL (ref 0.7–4.0)
LYMPHS PCT: 33.6 % (ref 12.0–46.0)
MCHC: 33.6 g/dL (ref 30.0–36.0)
MCV: 89.5 fl (ref 78.0–100.0)
Monocytes Absolute: 0.4 10*3/uL (ref 0.1–1.0)
Monocytes Relative: 5.3 % (ref 3.0–12.0)
Neutro Abs: 4.2 10*3/uL (ref 1.4–7.7)
Neutrophils Relative %: 58 % (ref 43.0–77.0)
PLATELETS: 231 10*3/uL (ref 150.0–400.0)
RBC: 4.47 Mil/uL (ref 4.22–5.81)
RDW: 14.1 % (ref 11.5–15.5)
WBC: 7.3 10*3/uL (ref 4.0–10.5)

## 2014-09-14 LAB — LIPID PANEL
CHOLESTEROL: 186 mg/dL (ref 0–200)
HDL: 41.5 mg/dL (ref >39.00)
LDL Cholesterol: 121 mg/dL — ABNORMAL HIGH (ref 0–99)
NONHDL: 144.5
TRIGLYCERIDES: 120 mg/dL (ref 0.0–149.0)
Total CHOL/HDL Ratio: 4
VLDL: 24 mg/dL (ref 0.0–40.0)

## 2014-09-14 LAB — COMPREHENSIVE METABOLIC PANEL
ALBUMIN: 3.9 g/dL (ref 3.5–5.2)
ALT: 8 U/L (ref 0–53)
AST: 13 U/L (ref 0–37)
Alkaline Phosphatase: 54 U/L (ref 39–117)
BUN: 11 mg/dL (ref 6–23)
CALCIUM: 9.6 mg/dL (ref 8.4–10.5)
CHLORIDE: 102 meq/L (ref 96–112)
CO2: 33 meq/L — AB (ref 19–32)
CREATININE: 0.92 mg/dL (ref 0.40–1.50)
GFR: 91.16 mL/min (ref >60.00)
GLUCOSE: 86 mg/dL (ref 70–99)
Potassium: 4 mEq/L (ref 3.5–5.1)
SODIUM: 138 meq/L (ref 135–145)
TOTAL PROTEIN: 7 g/dL (ref 6.0–8.3)
Total Bilirubin: 0.6 mg/dL (ref 0.2–1.2)

## 2014-09-14 LAB — VITAMIN B12: VITAMIN B 12: 434 pg/mL (ref 211–911)

## 2014-09-14 LAB — TSH: TSH: 0.79 u[IU]/mL (ref 0.35–4.50)

## 2014-09-14 LAB — SEDIMENTATION RATE: Sed Rate: 16 mm/hr (ref 0–22)

## 2014-09-14 NOTE — Assessment & Plan Note (Signed)
Pt uses clobetasol prn psoriatic rash.

## 2014-09-14 NOTE — Assessment & Plan Note (Addendum)
Pt has recently decreased dose from 2mg  TID to 1mg  QID. Discussed long term xanax is not a good strategy for anxiety control. Discussed possible change to longer acting klonopin which pt felt helpful in the past. Discussed antidepressant - but pt has not responded to several in past and is hesitant to re-trial. Will reassess next visit.

## 2014-09-14 NOTE — Assessment & Plan Note (Addendum)
Predominantly affecting bilateral lower extremities but also some hand arthritis present as well.  Some concern for psoriatic arthritis.  Will check for systemic inflammatory arthritis with ESR, RF, ANA, CCP and HLA B27. Will also check CBC, CMP today. Given some neuropathic description of sxs will also check TSH and B12. Will refer to rheum per patient request for further evaluation of autoimmune arthritis.  ADDENDUM ==> unable to add RF, ANA, CCP or HLA B27. Will see if pt wants to return for this prior to rheum appt.

## 2014-09-14 NOTE — Assessment & Plan Note (Signed)
Will continue to encourage smoking cessation. precontemplative currently.

## 2014-09-16 NOTE — Addendum Note (Signed)
Addended by: Eustaquio BoydenGUTIERREZ, Vinh Sachs on: 09/16/2014 08:50 AM   Modules accepted: Orders

## 2014-09-22 ENCOUNTER — Telehealth: Payer: Self-pay | Admitting: Family Medicine

## 2014-09-22 NOTE — Telephone Encounter (Signed)
Pt called stating he has an appointment with rhematology may 5  He wanted to know if there was anything else he can do until then  He cannot take gabapentin It messes up his stomach His legs are very painful  [facility]

## 2014-09-23 ENCOUNTER — Encounter: Payer: Self-pay | Admitting: Family Medicine

## 2014-09-23 DIAGNOSIS — M5416 Radiculopathy, lumbar region: Secondary | ICD-10-CM | POA: Insufficient documentation

## 2014-09-23 NOTE — Telephone Encounter (Addendum)
Records reviewed. plz have him pick up CDs to take with him to rheumatology appointment. plz fax records in Kim's box to rheum for upcoming appointment. As far as pain - would offer tramadol prescription while we get in to see rheum. If pt agrees, plz phone in tramadol 50mg  1 BID PRN pain #40 RF 0.

## 2014-09-24 NOTE — Telephone Encounter (Signed)
Late entry-spoke with patient 09/23/14 and he would like to try tramadol. Rx called in as directed. CD's placed up front for pick up and records faxed to rheum.

## 2014-10-11 ENCOUNTER — Encounter: Payer: Self-pay | Admitting: *Deleted

## 2014-10-22 ENCOUNTER — Ambulatory Visit: Payer: Commercial Managed Care - HMO | Admitting: Family Medicine

## 2014-10-26 ENCOUNTER — Encounter: Payer: Self-pay | Admitting: Family Medicine

## 2014-10-26 ENCOUNTER — Ambulatory Visit (INDEPENDENT_AMBULATORY_CARE_PROVIDER_SITE_OTHER): Payer: Commercial Managed Care - HMO | Admitting: Family Medicine

## 2014-10-26 ENCOUNTER — Emergency Department (HOSPITAL_COMMUNITY)
Admission: EM | Admit: 2014-10-26 | Discharge: 2014-10-26 | Disposition: A | Discharge status: Home / Self Care | Payer: Commercial Managed Care - HMO | Attending: Emergency Medicine | Admitting: Emergency Medicine

## 2014-10-26 ENCOUNTER — Encounter (HOSPITAL_COMMUNITY): Payer: Self-pay | Admitting: Emergency Medicine

## 2014-10-26 VITALS — BP 126/78 | HR 138 | Temp 98.3°F | Wt 108.5 lb

## 2014-10-26 DIAGNOSIS — F1310 Sedative, hypnotic or anxiolytic abuse, uncomplicated: Secondary | ICD-10-CM | POA: Diagnosis present

## 2014-10-26 DIAGNOSIS — Z720 Tobacco use: Secondary | ICD-10-CM | POA: Diagnosis not present

## 2014-10-26 DIAGNOSIS — F419 Anxiety disorder, unspecified: Secondary | ICD-10-CM | POA: Insufficient documentation

## 2014-10-26 DIAGNOSIS — F13230 Sedative, hypnotic or anxiolytic dependence with withdrawal, uncomplicated: Secondary | ICD-10-CM

## 2014-10-26 DIAGNOSIS — M1990 Unspecified osteoarthritis, unspecified site: Secondary | ICD-10-CM | POA: Insufficient documentation

## 2014-10-26 DIAGNOSIS — R7309 Other abnormal glucose: Secondary | ICD-10-CM | POA: Diagnosis not present

## 2014-10-26 DIAGNOSIS — Z79899 Other long term (current) drug therapy: Secondary | ICD-10-CM | POA: Insufficient documentation

## 2014-10-26 DIAGNOSIS — Z87442 Personal history of urinary calculi: Secondary | ICD-10-CM | POA: Diagnosis not present

## 2014-10-26 DIAGNOSIS — E161 Other hypoglycemia: Secondary | ICD-10-CM | POA: Diagnosis not present

## 2014-10-26 DIAGNOSIS — F13939 Sedative, hypnotic or anxiolytic use, unspecified with withdrawal, unspecified: Secondary | ICD-10-CM | POA: Insufficient documentation

## 2014-10-26 DIAGNOSIS — Z872 Personal history of diseases of the skin and subcutaneous tissue: Secondary | ICD-10-CM | POA: Insufficient documentation

## 2014-10-26 DIAGNOSIS — F13239 Sedative, hypnotic or anxiolytic dependence with withdrawal, unspecified: Secondary | ICD-10-CM | POA: Insufficient documentation

## 2014-10-26 DIAGNOSIS — F13930 Sedative, hypnotic or anxiolytic use, unspecified with withdrawal, uncomplicated: Secondary | ICD-10-CM

## 2014-10-26 LAB — COMPREHENSIVE METABOLIC PANEL
ALK PHOS: 62 U/L (ref 38–126)
ALT: 10 U/L — AB (ref 17–63)
AST: 16 U/L (ref 15–41)
Albumin: 4.1 g/dL (ref 3.5–5.0)
Anion gap: 4 — ABNORMAL LOW (ref 5–15)
BILIRUBIN TOTAL: 0.5 mg/dL (ref 0.3–1.2)
BUN: 22 mg/dL — AB (ref 6–20)
CALCIUM: 9.4 mg/dL (ref 8.9–10.3)
CO2: 33 mmol/L — AB (ref 22–32)
CREATININE: 0.83 mg/dL (ref 0.61–1.24)
Chloride: 103 mmol/L (ref 101–111)
GFR calc Af Amer: >60 mL/min (ref >60)
GLUCOSE: 101 mg/dL — AB (ref 70–99)
Potassium: 3.6 mmol/L (ref 3.5–5.1)
Sodium: 140 mmol/L (ref 135–145)
TOTAL PROTEIN: 7.6 g/dL (ref 6.5–8.1)

## 2014-10-26 LAB — CBC WITH DIFFERENTIAL/PLATELET
Basophils Absolute: 0 10*3/uL (ref 0.0–0.1)
Basophils Relative: 1 % (ref 0–1)
Eosinophils Absolute: 0.1 10*3/uL (ref 0.0–0.7)
Eosinophils Relative: 1 % (ref 0–5)
HEMATOCRIT: 43.1 % (ref 39.0–52.0)
HEMOGLOBIN: 14.3 g/dL (ref 13.0–17.0)
LYMPHS ABS: 2.5 10*3/uL (ref 0.7–4.0)
LYMPHS PCT: 32 % (ref 12–46)
MCH: 29.9 pg (ref 26.0–34.0)
MCHC: 33.2 g/dL (ref 30.0–36.0)
MCV: 90.2 fL (ref 78.0–100.0)
MONO ABS: 0.9 10*3/uL (ref 0.1–1.0)
MONOS PCT: 11 % (ref 3–12)
NEUTROS ABS: 4.5 10*3/uL (ref 1.7–7.7)
NEUTROS PCT: 55 % (ref 43–77)
Platelets: 294 10*3/uL (ref 150–400)
RBC: 4.78 MIL/uL (ref 4.22–5.81)
RDW: 12.8 % (ref 11.5–15.5)
WBC: 8 10*3/uL (ref 4.0–10.5)

## 2014-10-26 LAB — RAPID URINE DRUG SCREEN, HOSP PERFORMED
Amphetamines: NOT DETECTED
BARBITURATES: NOT DETECTED
Benzodiazepines: POSITIVE — AB
COCAINE: NOT DETECTED
Opiates: NOT DETECTED
TETRAHYDROCANNABINOL: NOT DETECTED

## 2014-10-26 LAB — ACETAMINOPHEN LEVEL: Acetaminophen (Tylenol), Serum: <10 ug/mL — ABNORMAL LOW (ref 10–30)

## 2014-10-26 LAB — SALICYLATE LEVEL: Salicylate Lvl: <4 mg/dL (ref 2.8–30.0)

## 2014-10-26 LAB — ETHANOL: Alcohol, Ethyl (B): <5 mg/dL (ref <5)

## 2014-10-26 MED ORDER — ALPRAZOLAM 0.5 MG PO TABS
1.0000 mg | ORAL_TABLET | Freq: Four times a day (QID) | ORAL | Status: DC
  Filled 2014-10-26: qty 2

## 2014-10-26 MED ORDER — DIAZEPAM 5 MG/ML IJ SOLN
2.5000 mg | Freq: Once | INTRAMUSCULAR | Status: AC
  Administered 2014-10-26: 2.5 mg via INTRAMUSCULAR
  Filled 2014-10-26: qty 2

## 2014-10-26 MED ORDER — ALPRAZOLAM 1 MG PO TABS: 1.0000 mg | ORAL_TABLET | Freq: Every evening | ORAL | Status: DC | PRN

## 2014-10-26 MED ORDER — ACETAMINOPHEN 325 MG PO TABS: 650.0000 mg | ORAL_TABLET | ORAL | Status: DC | PRN

## 2014-10-26 MED ORDER — KETOROLAC TROMETHAMINE 30 MG/ML IJ SOLN
30.0000 mg | Freq: Once | INTRAMUSCULAR | Status: AC
  Administered 2014-10-26: 30 mg via INTRAMUSCULAR
  Filled 2014-10-26: qty 1

## 2014-10-26 MED ORDER — OXYCODONE-ACETAMINOPHEN 5-325 MG PO TABS
2.0000 | ORAL_TABLET | Freq: Once | ORAL | Status: AC
  Administered 2014-10-26: 2 via ORAL
  Filled 2014-10-26: qty 2

## 2014-10-26 MED ORDER — TRAZODONE HCL 50 MG PO TABS: 50.0000 mg | ORAL_TABLET | Freq: Every day | ORAL | Status: DC

## 2014-10-26 MED ORDER — IBUPROFEN 200 MG PO TABS: 600.0000 mg | ORAL_TABLET | Freq: Three times a day (TID) | ORAL | Status: DC | PRN

## 2014-10-26 MED ORDER — DIAZEPAM 5 MG/ML IJ SOLN: 2.5000 mg | Freq: Once | INTRAMUSCULAR | Status: DC

## 2014-10-26 NOTE — Discharge Instructions (Signed)
Benzodiazepine Withdrawal  °Benzodiazepines are a group of drugs that are prescribed for both short-term and long-term treatment of a variety of medical conditions. For some of these conditions, such as seizures and sudden and severe muscle spasms, they are used only for a few hours or a few days. For other conditions, such as anxiety, sleep problems, or frequent muscle spasms or to help prevent seizures, they are used for an extended period, usually weeks or months. °Benzodiazepines work by changing the way your brain functions. Normally, chemicals in your brain called neurotransmitters send messages between your brain cells. The neurotransmitter that benzodiazepines affect is called gamma-aminobutyric acid (GABA). GABA sends out messages that have a calming effect on many of the functions of your brain. Benzodiazepines make these messages stronger and increase this calming effect. °Short-term use of benzodiazepines usually does not cause problems when you stop taking the drugs. However, if you take benzodiazepines for a long time, your body can adjust to the drug and require more of it to produce the same effect (drug tolerance). Eventually, you can develop physical dependence on benzodiazepines, which is when you experience negative effects if your dosage of benzodiazepines is reduced or stopped too quickly. These negative effects are called symptoms of withdrawal. °SYMPTOMS °Symptoms of withdrawal may begin anytime within the first 10 days after you stop taking the benzodiazepine. They can last from several weeks up to a few months but usually are the worst between the first 10 to 14 days.  °The actual symptoms also vary, depending on the type of benzodiazepine you take. Possible symptoms include: °· Anxiety. °· Excitability. °· Irritability. °· Depression. °· Mood swings. °· Trouble sleeping. °· Confusion. °· Uncontrollable shaking (tremors). °· Muscle weakness. °· Seizures. °DIAGNOSIS °To diagnose  benzodiazepine withdrawal, your caregiver will examine you for certain signs, such as: °· Rapid heartbeat. °· Rapid breathing. °· Tremors. °· High blood pressure. °· Fever. °· Mood changes. °Your caregiver also may ask the following questions about your use of benzodiazepines: °· What type of benzodiazepine did you take? °· How much did you take each day? °· How long did you take the drug? °· When was the last time you took the drug? °· Do you take any other drugs? °· Have you had alcohol recently? °· Have you had a seizure recently? °· Have you lost consciousness recently? °· Have you had trouble remembering recent events? °· Have you had a recent increase in anxiety, irritability, or trouble sleeping? °A drug test also may be administered. °TREATMENT °The treatment for benzodiazepine withdrawal can vary, depending on the type and severity of your symptoms, what type of benzodiazepine you have been taking, and how long you have been taking the benzodiazepine. Sometimes it is necessary for you to be treated in a hospital, especially if you are at risk of seizures.  °Often, treatment includes a prescription for a long-acting benzodiazepine, the dosage of which is reduced slowly over a long period. This period could be several weeks or months. Eventually, your dosage will be reduced to a point that you can stop taking the drug, without experiencing withdrawal symptoms. This is called tapered withdrawal. Occasionally, minor symptoms of withdrawal continue for a few days or weeks after you have completed a tapered withdrawal. °SEEK IMMEDIATE MEDICAL CARE IF: °· You have a seizure. °· You develop a craving for drugs or alcohol. °· You begin to experience symptoms of withdrawal during your tapered withdrawal. °· You become very confused. °· You lose consciousness. °· You   have trouble breathing. °· You think about hurting yourself or someone else. °Document Released: 05/24/2011 Document Revised: 08/27/2011 Document  Reviewed: 05/24/2011 °ExitCare® Patient Information ©2015 [employer]. This information is not intended to replace advice given to you by your health care provider. Make sure you discuss any questions you have with your health care provider. ° °

## 2014-10-26 NOTE — ED Notes (Signed)
[facility] Expected date:  Expected time:  Means of arrival:  Comments: EMS - xanax wdl

## 2014-10-26 NOTE — Patient Instructions (Signed)
To ER for xanax withdrawal. Return to see me after you leave the hospital.

## 2014-10-26 NOTE — ED Notes (Signed)
Pt sent from [facility] for evaluation of xanax withdrawal; symptoms present ataxia, tachycardia, and unsteady gait.

## 2014-10-26 NOTE — ED Notes (Signed)
Kathlene NovemberMike Boling/brother [phone] Irene/mother [phone] Homer/father [phone]

## 2014-10-26 NOTE — Assessment & Plan Note (Signed)
Tachycardic to 130s, ataxic, severe anxiety after stopping xanax 1mg  QID cold [place].  Discussed with patient and dad - safest place to be is at ER and will send via ambulance today. Will need to start detox there. Pt/husband agree with plan.

## 2014-10-26 NOTE — ED Notes (Signed)
Ham sandwich, cheese, and ice water given to pt.

## 2014-10-26 NOTE — Progress Notes (Signed)
Pre visit review using our clinic review tool, if applicable. No additional management support is needed unless otherwise documented below in the visit note. 

## 2014-10-26 NOTE — ED Provider Notes (Signed)
CSN: [id number]     Arrival date & time 10/26/14  1249 History   First MD Initiated Contact with Patient 10/26/14 1254     Chief Complaint  Patient presents with  . Withdrawal      (Consider location/radiation/quality/duration/timing/severity/associated sxs/prior Treatment) HPI   PCP: Eustaquio Boyden, MD Blood pressure 127/87, pulse 90, temperature 98.3 F (36.8 C), temperature source Oral, resp. rate 18, SpO2 98 %.  Jason Rodgers is a 54 y.o.male with a significant PMH of chronic pain, psoriasis, psoriatic arthritis, smoker, generalized anxiety disorder and opiate abuse presents to the ER with complaints of benzodiazepine withdrawal. Patient was seen at our primary care just prior to arrival and transferred by ambulance to [facility] Emergency department for benzo withdrawal. In the primary care doctor's office his pulse was 138 and has improved to 90. He has not had any intervention prior to arrival. Patient ran out of his Xanax 4 days ago and decided that he was going to quit cold [place] on his own. The patient reports knowing that he should not do this and that it is dangerous but decided to do it anyways. He's been having tremors, ataxia, insomnia, if cultures with his vision. He has not had any seizures or hallucinations. The last dose of Xanax was this past Thursday. The patient reports wanting to detox from Xanax because he does not want to be addicted to medications. He has grandkids that he wants to be there for. The patient reports being on Xanax 1 mg 4 times a day for the past 15 years. He also experiences chronic pain for which Peyton Najjar caught and Neurontin do not help. He would like to go through detox and talked with the psychiatrist and counselor. Patient is not currently on any narcotic pain medication.   Past Medical History  Diagnosis Date  . History of kidney stones 2000s  . Arthritis   . Psoriasis   . Smoker 2000  . GAD (generalized anxiety disorder)   . Opiate abuse, episodic      per prior PCP records   Past Surgical History  Procedure Laterality Date  . Knee surgery Left 2010    meniscal tear Gavin Potters)  . Kidney stone surgery  2006  . Shoulder surgery Left 04/2004    [facility] ([facility])   Family History  Problem Relation Age of Onset  . Arthritis Mother   . Hypertension Mother   . Arthritis Father   . CAD Father     MI  . Arthritis Paternal Grandmother   . Diabetes Neg Hx   . Cancer Maternal Aunt     breast  . Stroke Neg Hx    History  Substance Use Topics  . Smoking status: Current Every Day Smoker -- 0.50 packs/day    Types: Cigarettes  . Smokeless tobacco: Never Used  . Alcohol Use: No    Review of Systems  10 Systems reviewed and are negative for acute change except as noted in the HPI.     Allergies  Gabapentin and Lyrica  Home Medications   Prior to Admission medications   Medication Sig Start Date End Date Taking? Authorizing Provider  traZODone (DESYREL) 50 MG tablet Take 50-100 mg by mouth at bedtime.   Yes Historical Provider, MD  ALPRAZolam Prudy Feeler) 1 MG tablet Take 1 tablet (1 mg total) by mouth at bedtime as needed for anxiety (QID for 3 days, TID for 3 days, BID for 3 days and q day for 3 days.). 10/26/14  Garnetta Fedrick Neva SeatGreene, PA-C   BP 111/73 mmHg  Pulse 101  Temp(Src) 98 F (36.7 C) (Oral)  Resp 18  SpO2 96% Physical Exam  Constitutional: He appears well-developed and well-nourished. No distress.  HENT:  Head: Normocephalic and atraumatic.  Eyes: Pupils are equal, round, and reactive to light.  Neck: Normal range of motion. Neck supple.  Cardiovascular: Normal rate and regular rhythm.   Pulmonary/Chest: Effort normal.  Abdominal: Soft.  Neurological: He is alert.  Skin: Skin is warm and dry.  No rash, redness or swelling to skin, extremities or joints.  Psychiatric: His mood appears anxious. His speech is not rapid and/or pressured. He is not withdrawn and not actively hallucinating. He expresses no  homicidal and no suicidal ideation.  + tearful  Nursing note and vitals reviewed.   ED Course  Procedures (including critical care time) Labs Review Labs Reviewed  COMPREHENSIVE METABOLIC PANEL - Abnormal; Notable for the following:    CO2 33 (*)    Glucose, Bld 101 (*)    BUN 22 (*)    ALT 10 (*)    Anion gap 4 (*)    All other components within normal limits  URINE RAPID DRUG SCREEN (HOSP PERFORMED) - Abnormal; Notable for the following:    Benzodiazepines POSITIVE (*)    All other components within normal limits  ACETAMINOPHEN LEVEL - Abnormal; Notable for the following:    Acetaminophen (Tylenol), Serum <10 (*)    All other components within normal limits  CBC WITH DIFFERENTIAL/PLATELET  ETHANOL  SALICYLATE LEVEL  CBC WITH DIFFERENTIAL/PLATELET    Imaging Review No results found.   EKG Interpretation None      MDM   Final diagnoses:  Benzodiazepine withdrawal, uncomplicated   Medications  acetaminophen (TYLENOL) tablet 650 mg (not administered)  ibuprofen (ADVIL,MOTRIN) tablet 600 mg (not administered)  traZODone (DESYREL) tablet 50-100 mg (not administered)  diazepam (VALIUM) injection 2.5 mg (2.5 mg Intramuscular Given 10/26/14 1331)  ketorolac (TORADOL) 30 MG/ML injection 30 mg (30 mg Intramuscular Given 10/26/14 1331)  oxyCODONE-acetaminophen (PERCOCET/ROXICET) 5-325 MG per tablet 2 tablet (2 tablets Oral Given 10/26/14 1454)    The patient requests detox or evaluation for Benzos. His chronic pain and chronic anxiety not well controlled. His pulse was 138, but improved without intervention [facility] to 90. Patient is having withdrawal symptoms but does not appear to be actively withdrawing at this time. He is incredibly anxious and will need to be evaluated after medically cleared for inpatient psych treatment.  2:45 pm Patient complaining of chronic pain. Given a one time dose of Percocet here in the ED. His blood work is not acute, some  dehydration but the patient reports not eating and drinking normal due to anxiety and pain. ETOH, Salicylate, Acetaminophen, and CBC unremarkable.  UDS: + benzos CMP:  BUN 22  The patient does not meet criteria for admission for Benzo withdrawal, and we no longer do inpatient detox for Benzos. I spoke with his PCP, Dr. Thornell MuleGuiterriez, TTS and my supervising attending, Dr. Elesa MassedWard. The patient will be sent home with outpatient resources. He will get a taper dose of Xanax QID for 3 days, TID for 3 days, BID for 3 days, and q day. He will see Dr. Rubye BeachGuitteriez on Thursday at 1pm for follow-up./ He has an appointment with rheumatology pending.  The patient is tearful but understands and is agreeable. He prefers to taper off the medication as recommend and will utilize outpatient resources.  54 y.o.Harvie Heckandy  W Single's evaluation in the Emergency Department is complete. It has been determined that no acute conditions requiring further emergency intervention are present at this time. The patient/guardian have been advised of the diagnosis and plan. We have discussed signs and symptoms that warrant return to the ED, such as changes or worsening in symptoms.  Vital signs are stable at discharge. Filed Vitals:   10/26/14 1547  BP: 111/73  Pulse: 101  Temp: 98 F (36.7 C)  Resp: 18    Patient/guardian has voiced understanding and agreed to follow-up with the PCP or specialist.    Marlon Peliffany Aaliyana Fredericks, PA-C 10/26/14 1631  Layla MawKristen N Ward, DO 10/26/14 [id number]

## 2014-10-26 NOTE — Progress Notes (Addendum)
BP 126/78 mmHg  Pulse 138  Temp(Src) 98.3 F (36.8 C) (Oral)  Wt 108 lb 8 oz (49.215 kg)  SpO2 97%   CC: 6 wk f/u visit  Subjective:    Patient ID: Jason Rodgers, male    DOB: 1960-12-22, 54 y.o.   MRN: [id number]  HPI: Jason RiisRandy W Horlacher is a 54 y.o. male presenting on 10/26/2014 for Follow-up   Pt established with me last visit. GAD - was on xanax 1mg  QID prescribed by Tomi BambergerSusan Fuller (had self tapered down from 2mg  TID). Also with chronic diffuse joint pains s/p knee surgeries prior on narcotics, and on disability for L shoulder injury s/p surgery 2006. ?autoimmune arthritis and recently referred to rheumatologist. He cancelled rheumatologist appointment last week.   Family has been concerned about overuse of controlled substances in the past.   At last visit, he was on xanax 1mg  QID. He ran out 4 days ago and called our line for refill, was transferred to triage and hung up. He decided to come off xanax on his own although he knew he shouldn't and it said on the bottle not to. Trouble with vision, tremors, ataxia, unable to sleep. No seizures, hallucinations. Last xanax dose was Thursday night.   Chronic pain in legs. Tramadol not helpful.   Failed meds in the past: Lyrica caused itchy rash. Gabapentin caused diarrhea. celebrex ineffective. Sertraline and amitriptyline were ineffective.  Klonopin was helpful.   Relevant past medical, surgical, family and social history reviewed and updated as indicated. Interim medical history since our last visit reviewed. Allergies and medications reviewed and updated. Current Outpatient Prescriptions on File Prior to Visit  Medication Sig  . traZODone (DESYREL) 50 MG tablet Take 50-100 mg by mouth at bedtime.  Marland Kitchen. alprazolam (XANAX) 1 MG tablet Take 1 tablet (1 mg total) by mouth 4 (four) times daily.  . Clobetasol Propionate (CLOBEX EX) Apply 1 application topically daily.  Marland Kitchen. ibuprofen (ADVIL,MOTRIN) 200 MG tablet Take 400 mg by mouth every 6 (six)  hours as needed.   No current facility-administered medications on file prior to visit.   Past Medical History  Diagnosis Date  . History of kidney stones 2000s  . Arthritis   . Psoriasis   . Smoker 2000  . GAD (generalized anxiety disorder)   . Opiate abuse, episodic     per prior PCP records    Past Surgical History  Procedure Laterality Date  . Knee surgery Left 2010    meniscal tear Gavin Potters([facility])  . Kidney stone surgery  2006  . Shoulder surgery Left 04/2004    [place] ([facility])    History  Substance Use Topics  . Smoking status: Current Every Day Smoker -- 0.50 packs/day    Types: Cigarettes  . Smokeless tobacco: Never Used  . Alcohol Use: No   Family History  Problem Relation Age of Onset  . Arthritis Mother   . Hypertension Mother   . Arthritis Father   . CAD Father     MI  . Arthritis Paternal Grandmother   . Diabetes Neg Hx   . Cancer Maternal Aunt     breast  . Stroke Neg Hx    Review of Systems Per HPI unless specifically indicated above     Objective:    BP 126/78 mmHg  Pulse 138  Temp(Src) 98.3 F (36.8 C) (Oral)  Wt 108 lb 8 oz (49.215 kg)  SpO2 97%  Wt Readings from Last 3 Encounters:  10/26/14 108  lb 8 oz (49.215 kg)  09/13/14 117 lb 12.8 oz (53.434 kg)  08/25/13 123 lb 8 oz (56.019 kg)    Physical Exam  Constitutional: He is oriented to person, place, and time. He appears well-developed and well-nourished. No distress.  HENT:  Mouth/Throat: Oropharynx is clear and moist. No oropharyngeal exudate.  Eyes: Conjunctivae and EOM are normal. Pupils are equal, round, and reactive to light.  Cardiovascular: Regular rhythm and normal heart sounds.  Tachycardia present.   No murmur heard. Pulmonary/Chest: Effort normal and breath sounds normal. No respiratory distress. He has no wheezes. He has no rales.  Neurological: He is alert and oriented to person, place, and time. Coordination and gait abnormal.  Marked high amplitude  postural tremor, ataxic gait  Psychiatric: His speech is normal. Thought content normal. His mood appears anxious. His affect is labile. He is agitated.  Nursing note and vitals reviewed.  Results for orders placed or performed in visit on 10/11/14  Comprehensive metabolic panel  Result Value Ref Range   Glucose 93    Uric Acid 3.3    Creat 1.02    Sodium 143 137 - 147 mmol/L   Potassium 3.9 mmol/L   Total Protein 6.8 g/dL   Bilirubin 0.3    Alkaline Phosphatase 62 U/L   LDH 137 u/L   AST 14 U/L   ALT 9   Lipid panel  Result Value Ref Range   Cholesterol, Total 143    Triglycerides 74    HDL 46 35 - 70 mg/dL  TSH  Result Value Ref Range   TSH 2.620   Vit D  25 hydroxy (rtn osteoporosis monitoring)  Result Value Ref Range   Vit D, 25-Hydroxy 34.8   CBC  Result Value Ref Range   WBC 5.9    HGB 14.9 g/dL   platelet count [id number]       Assessment & Plan:   Problem List Items Addressed This Visit    Benzodiazepine withdrawal - Primary    Tachycardic to 130s, ataxic, severe anxiety after stopping xanax 1mg  QID cold [place].  Discussed with patient and dad - safest place to be is at ER and will send via ambulance today. Will need to start detox there. Pt/husband agree with plan.          Follow up plan: No Follow-up on file.

## 2014-10-28 ENCOUNTER — Encounter: Payer: Self-pay | Admitting: Family Medicine

## 2014-10-28 ENCOUNTER — Ambulatory Visit (INDEPENDENT_AMBULATORY_CARE_PROVIDER_SITE_OTHER): Payer: Commercial Managed Care - HMO | Admitting: Family Medicine

## 2014-10-28 ENCOUNTER — Telehealth: Payer: Self-pay | Admitting: Family Medicine

## 2014-10-28 VITALS — BP 100/60 | HR 88 | Temp 98.0°F | Wt 113.8 lb

## 2014-10-28 DIAGNOSIS — M25562 Pain in left knee: Secondary | ICD-10-CM

## 2014-10-28 DIAGNOSIS — M25561 Pain in right knee: Secondary | ICD-10-CM

## 2014-10-28 DIAGNOSIS — G894 Chronic pain syndrome: Secondary | ICD-10-CM

## 2014-10-28 DIAGNOSIS — F13239 Sedative, hypnotic or anxiolytic dependence with withdrawal, unspecified: Secondary | ICD-10-CM

## 2014-10-28 DIAGNOSIS — Z720 Tobacco use: Secondary | ICD-10-CM | POA: Diagnosis not present

## 2014-10-28 DIAGNOSIS — F411 Generalized anxiety disorder: Secondary | ICD-10-CM | POA: Diagnosis not present

## 2014-10-28 DIAGNOSIS — M5416 Radiculopathy, lumbar region: Secondary | ICD-10-CM

## 2014-10-28 DIAGNOSIS — F17200 Nicotine dependence, unspecified, uncomplicated: Secondary | ICD-10-CM

## 2014-10-28 DIAGNOSIS — F13939 Sedative, hypnotic or anxiolytic use, unspecified with withdrawal, unspecified: Secondary | ICD-10-CM

## 2014-10-28 MED ORDER — CLONAZEPAM 0.5 MG PO TABS: 0.5000 mg | ORAL_TABLET | Freq: Two times a day (BID) | ORAL | Status: DC

## 2014-10-28 MED ORDER — GABAPENTIN 100 MG PO CAPS: 100.0000 mg | ORAL_CAPSULE | Freq: Three times a day (TID) | ORAL | Status: DC

## 2014-10-28 MED ORDER — TRAMADOL HCL 50 MG PO TABS: 50.0000 mg | ORAL_TABLET | Freq: Three times a day (TID) | ORAL | Status: DC | PRN

## 2014-10-28 MED ORDER — PREDNISONE 20 MG PO TABS: ORAL_TABLET | ORAL | Status: DC

## 2014-10-28 NOTE — Progress Notes (Signed)
BP 100/60 mmHg  Pulse 88  Temp(Src) 98 F (36.7 C) (Oral)  Wt 113 lb 12 oz (51.597 kg)   CC: ER f/u visit  Subjective:    Patient ID: Jason Rodgers, male    DOB: 1960/12/20, 54 y.o.   MRN: [id number]  HPI: Jason Rodgers is a 54 y.o. male presenting on 10/28/2014 for Follow-up   See prior note for details. [facility] with valium 2.5mg  IM as well as percocet x2 and toradol IM. Sent home with alprazolam taper - QID x 3d, TID x3d, BID x3d, and QD x3d #30 pills.   GAD - significant anxiety.   Persistent severe leg pains.   Relevant past medical, surgical, family and social history reviewed and updated as indicated. Interim medical history since our last visit reviewed. Allergies and medications reviewed and updated. Current Outpatient Prescriptions on File Prior to Visit  Medication Sig  . ALPRAZolam (XANAX) 1 MG tablet Take 1 tablet (1 mg total) by mouth at bedtime as needed for anxiety (QID for 3 days, TID for 3 days, BID for 3 days and q day for 3 days.).  Marland Kitchen. traZODone (DESYREL) 50 MG tablet Take 50-100 mg by mouth at bedtime.   No current facility-administered medications on file prior to visit.    Review of Systems Per HPI unless specifically indicated above     Objective:    BP 100/60 mmHg  Pulse 88  Temp(Src) 98 F (36.7 C) (Oral)  Wt 113 lb 12 oz (51.597 kg)  Wt Readings from Last 3 Encounters:  10/28/14 113 lb 12 oz (51.597 kg)  10/26/14 108 lb 8 oz (49.215 kg)  09/13/14 117 lb 12.8 oz (53.434 kg)   Body mass index is 17.69 kg/(m^2).  Physical Exam  Constitutional: He appears well-developed and well-nourished. No distress.  thin  HENT:  Mouth/Throat: Oropharynx is clear and moist. No oropharyngeal exudate.  Eyes: Conjunctivae and EOM are normal. Pupils are equal, round, and reactive to light. No scleral icterus.  Cardiovascular: Normal rate, regular rhythm, normal heart sounds and intact distal pulses.   No murmur heard. Pulmonary/Chest: Effort  normal and breath sounds normal. No respiratory distress. He has no wheezes. He has no rales.  Musculoskeletal: He exhibits no edema.  Neurological:  Tremor has resolved  Nursing note and vitals reviewed.  Results for orders placed or performed during the hospital encounter of 10/26/14  CBC with Differential/Platelet  Result Value Ref Range   WBC 8.0 4.0 - 10.5 K/uL   RBC 4.78 4.22 - 5.81 MIL/uL   Hemoglobin 14.3 13.0 - 17.0 g/dL   HCT 40.943.1 81.139.0 - 91.452.0 %   MCV 90.2 78.0 - 100.0 fL   MCH 29.9 26.0 - 34.0 pg   MCHC 33.2 30.0 - 36.0 g/dL   RDW 78.212.8 95.611.5 - 21.315.5 %   Platelets 294 150 - 400 K/uL   Neutrophils Relative % 55 43 - 77 %   Neutro Abs 4.5 1.7 - 7.7 K/uL   Lymphocytes Relative 32 12 - 46 %   Lymphs Abs 2.5 0.7 - 4.0 K/uL   Monocytes Relative 11 3 - 12 %   Monocytes Absolute 0.9 0.1 - 1.0 K/uL   Eosinophils Relative 1 0 - 5 %   Eosinophils Absolute 0.1 0.0 - 0.7 K/uL   Basophils Relative 1 0 - 1 %   Basophils Absolute 0.0 0.0 - 0.1 K/uL  Comprehensive metabolic panel  Result Value Ref Range   Sodium 140  135 - 145 mmol/L   Potassium 3.6 3.5 - 5.1 mmol/L   Chloride 103 101 - 111 mmol/L   CO2 33 (H) 22 - 32 mmol/L   Glucose, Bld 101 (H) 70 - 99 mg/dL   BUN 22 (H) 6 - 20 mg/dL   Creatinine, Ser 9.600.83 0.61 - 1.24 mg/dL   Calcium 9.4 8.9 - 45.410.3 mg/dL   Total Protein 7.6 6.5 - 8.1 g/dL   Albumin 4.1 3.5 - 5.0 g/dL   AST 16 15 - 41 U/L   ALT 10 (L) 17 - 63 U/L   Alkaline Phosphatase 62 38 - 126 U/L   Total Bilirubin 0.5 0.3 - 1.2 mg/dL   GFR calc non Af Amer >60 >60 mL/min   GFR calc Af Amer >60 >60 mL/min   Anion gap 4 (L) 5 - 15  Urine rapid drug screen (hosp performed)  Result Value Ref Range   Opiates NONE DETECTED NONE DETECTED   Cocaine NONE DETECTED NONE DETECTED   Benzodiazepines POSITIVE (A) NONE DETECTED   Amphetamines NONE DETECTED NONE DETECTED   Tetrahydrocannabinol NONE DETECTED NONE DETECTED   Barbiturates NONE DETECTED NONE DETECTED  Ethanol  Result  Value Ref Range   Alcohol, Ethyl (B) <5 <5 mg/dL  Salicylate level  Result Value Ref Range   Salicylate Lvl <4.0 2.8 - 30.0 mg/dL  Acetaminophen level  Result Value Ref Range   Acetaminophen (Tylenol), Serum <10 (L) 10 - 30 ug/mL      Assessment & Plan:   Problem List Items Addressed This Visit    Benzodiazepine withdrawal - Primary    Back on xanax currently taking QID with #30 prescribed by ER on Tuesday.      Bilateral knee pain    Osteoarthritis vs other arthritis ?psoriatic. Pending referral to rheum for further evaluation - pt will call today to reschedule appointment.      Chronic pain syndrome    Longstanding issue with chronic pain.  Endorses prednisone provided significant pain relief in the past - will prescribe 1 wk taper today. Endorses gabapentin is helpful for leg pains but he is unable to tolerate 300mg  TID (nausea/vomiting/cramping)- so I have sent in 100mg  to start lower taper to TID PRN. Discussed I recommended against high strength narcotics from our office due to his personal narcotic and drug abuse history, and offered pain clinic referral vs trial tramadol. Pt opts to try tramadol - #60 to take BID prescribed today.      Generalized anxiety disorder    Longterm on xanax currently on 1mg  QID, advised decrease this to TID dosing for next 7 days until finishes course prescribed by ER - then start new anxiolytic klonopin 0.5mg  BID scheduled - start next Wednesday. Provided with written post dated script to fill 11/02/2014. Again reviewed addition and abuse potential of medication as well as dependence/ tolerance and how patient should NEVER suddenly stop benzo.      Lumbar radiculopathy    The fact that gabapentin is helpful for his leg pains suggests radicular component to his chronic pain.      Relevant Medications   gabapentin (NEURONTIN) 100 MG capsule   clonazePAM (KLONOPIN) 0.5 MG tablet   Smoker    Continue to encourage cessation. Precontemplative. 1/2  ppd           Follow up plan: Return in about 4 weeks (around 11/25/2014), or as needed, for follow up visit.

## 2014-10-28 NOTE — Assessment & Plan Note (Signed)
Continue to encourage cessation. Precontemplative. 1/2 ppd

## 2014-10-28 NOTE — Assessment & Plan Note (Addendum)
Longterm on xanax currently on 1mg  QID, advised decrease this to TID dosing for next 7 days until finishes course prescribed by ER - then start new anxiolytic klonopin 0.5mg  BID scheduled - start next Wednesday. Provided with written post dated script to fill 11/02/2014. Again reviewed addition and abuse potential of medication as well as dependence/ tolerance and how patient should NEVER suddenly stop benzo.

## 2014-10-28 NOTE — Assessment & Plan Note (Signed)
Osteoarthritis vs other arthritis ?psoriatic. Pending referral to rheum for further evaluation - pt will call today to reschedule appointment.

## 2014-10-28 NOTE — Telephone Encounter (Signed)
Patient's mother is asking for you to call her about a rheumatologist referral.  She asked for you to call her back tomorrow afternoon.

## 2014-10-28 NOTE — Assessment & Plan Note (Signed)
Back on xanax currently taking QID with #30 prescribed by ER on Tuesday.

## 2014-10-28 NOTE — Patient Instructions (Addendum)
Change xanax prescription to 1 mg three times daily starting today for next 7 days.  Klonopin prescription provided today to start next Thursday 0.5mg  twice daily.  Tramadol 50mg  twice daily as needed - prescription printed out today.  Try gabapentin 100mg  instead of 300mg  to see if better tolerated and same leg pain relief. Prednisone course for next week.  Return in 1 month for follow up.  Call Dr Shawnee KnappBeekman's office to reschedule.

## 2014-10-28 NOTE — Progress Notes (Signed)
Pre visit review using our clinic review tool, if applicable. No additional management support is needed unless otherwise documented below in the visit note. 

## 2014-10-28 NOTE — Assessment & Plan Note (Addendum)
Longstanding issue with chronic pain.  Endorses prednisone provided significant pain relief in the past - will prescribe 1 wk taper today. Endorses gabapentin is helpful for leg pains but he is unable to tolerate 300mg  TID (nausea/vomiting/cramping)- so I have sent in 100mg  to start lower taper to TID PRN. Discussed I recommended against high strength narcotics from our office due to his personal narcotic and drug abuse history, and offered pain clinic referral vs trial tramadol. Pt opts to try tramadol - #60 to take BID prescribed today.

## 2014-10-28 NOTE — Assessment & Plan Note (Signed)
The fact that gabapentin is helpful for his leg pains suggests radicular component to his chronic pain.

## 2014-10-29 NOTE — Telephone Encounter (Signed)
Message left for patient's mother to return my call.

## 2014-10-29 NOTE — Telephone Encounter (Signed)
Rheumatology can't see him until 12/16/14. Is there anyone else you prefer him to see that could see him sooner or do you want him to stick with this particular doctor?

## 2014-10-30 NOTE — Telephone Encounter (Signed)
Would keep rheum appt. Would offer pain management referral in [place].

## 2014-11-02 NOTE — Telephone Encounter (Signed)
referral placed

## 2014-11-02 NOTE — Telephone Encounter (Signed)
Patient notified as instructed by telephone and verbalized understanding. Patient stated that he would like a referral to pain management in [place]. Advised patient that one of the referral coordinators will be in touch with him to get this set up.

## 2014-11-02 NOTE — Addendum Note (Signed)
Addended by: Eustaquio BoydenGUTIERREZ, [place] Dingley on: 11/02/2014 11:05 PM   Modules accepted: Orders

## 2014-11-03 ENCOUNTER — Telehealth: Payer: Self-pay

## 2014-11-03 NOTE — Telephone Encounter (Signed)
Pt left v/m; pt was seen 10/28/14; prednisone is completed, pt is hurting all over; pt is wired up and has not been able to sleep last night at all.. Pt used to take trazodone; pt does not think clonazepam is helping. Pt request cb. [facility].

## 2014-11-04 MED ORDER — TRAZODONE HCL 50 MG PO TABS: 50.0000 mg | ORAL_TABLET | Freq: Every day | ORAL | Status: DC

## 2014-11-04 NOTE — Telephone Encounter (Signed)
Patient called back and wanted to let Selena BattenKim know [facility] doesn't close until 6:00. Patient said he hasn't slept in 2 nights and would like to get the rx filled tonight.

## 2014-11-04 NOTE — Telephone Encounter (Signed)
Spoke with patient and he does want trazodone. Advised to give klonopin more time as well. He verbalized understanding. Trazodone sent in as directed.

## 2014-11-04 NOTE — Telephone Encounter (Signed)
Ok to try trazodone for sleep if pt wants to - if so, may send in trazodone 50mg  to take 1/2-1 tablet nightly as needed for sleep #30 RF 3.  Would give klonopin more time as he's only been on in a few days.

## 2014-11-10 ENCOUNTER — Telehealth: Payer: Self-pay | Admitting: Family Medicine

## 2014-11-10 NOTE — Telephone Encounter (Signed)
Patient feels that the Tramadol would work better and last longer if he could take 2 pills at a time twice a day. The medicine effect wears off and when he has taken it like that it seems to last. When he takes it like its prescribed he is taking tylenol on top of it to ease the pain. Also with the Klonopin if he could get 1mg  twice a day he knows that he would feel a lot better. Today he took the Klonopin at 7:10am and its 8:50am and his hands  are trembling still. He would do better if that medicine was increased as well.This patient wanted you to know that he still is dealing with a lot of stress from his past that he deals with on a day to day basis. His son was involved in a bad accident that killed someone and its still a very painful thing. He appreciates all of your help. After talking with him about the [facility] he wants to proceed with the Referral. He is aware that they are scheduling out into August. Please have someone call him with what you decide.

## 2014-11-11 MED ORDER — CLONAZEPAM 1 MG PO TABS: 1.0000 mg | ORAL_TABLET | Freq: Two times a day (BID) | ORAL | Status: DC

## 2014-11-11 MED ORDER — TRAMADOL HCL 50 MG PO TABS: ORAL_TABLET | ORAL | Status: DC

## 2014-11-11 NOTE — Telephone Encounter (Signed)
Let's increase tramadol to 100mg  in am (2 pills) and 50mg  (1 pill) at night time - total of 3 pills a day. Ok to take tylenol on top. As far as klonopin - ok to increase to 1mg  twice daily - double up until he runs out. May phone in new prescriptions to fill on 11/20/2014 - current Rx should last him until then.

## 2014-11-11 NOTE — Telephone Encounter (Signed)
Notified pt.  Pt states that he is almost out of Tramadol now because he has been doubling the medication, he does not have enough to last him until June 4th.

## 2014-11-12 ENCOUNTER — Other Ambulatory Visit: Payer: Self-pay | Admitting: Family Medicine

## 2014-11-12 MED ORDER — TRAMADOL HCL 50 MG PO TABS: ORAL_TABLET | ORAL | Status: DC

## 2014-11-12 NOTE — Telephone Encounter (Signed)
Pt understands he has taken tramadol incorrectly but pt is out of tramadol and wants to know if can get med now instead of having to wait until 11/20/14. Pt is taking 9 - 12 tylenol daily due to severe knee and feet pain.pt has appt to see rheumatologist 12/16/14. Pt request cb ASAP. [place].

## 2014-11-12 NOTE — Telephone Encounter (Signed)
plz phone in ok to refill tomorrow. Notify pt will not refill early again.

## 2014-11-16 NOTE — Telephone Encounter (Signed)
Pt left v/m requesting status of tramadol refill. 

## 2014-11-16 NOTE — Telephone Encounter (Signed)
Rx called in as directed and patient notified.  

## 2014-11-17 DIAGNOSIS — R892 Abnormal level of other drugs, medicaments and biological substances in specimens from other organs, systems and tissues: Secondary | ICD-10-CM

## 2014-11-17 HISTORY — DX: Abnormal level of other drugs, medicaments and biological substances in specimens from other organs, systems and tissues: R89.2

## 2014-11-19 ENCOUNTER — Other Ambulatory Visit: Payer: Self-pay | Admitting: *Deleted

## 2014-11-19 MED ORDER — CLONAZEPAM 1 MG PO TABS: 1.0000 mg | ORAL_TABLET | Freq: Two times a day (BID) | ORAL | Status: DC

## 2014-11-19 NOTE — Telephone Encounter (Signed)
Pt left v/m requesting cb 11/19/14 that [facility] so pt can pick up on 11/20/14. Pt will be out of klonopin on 11/20/14.

## 2014-11-19 NOTE — Telephone Encounter (Signed)
Rx called in as directed and patient notified.  

## 2014-11-30 ENCOUNTER — Encounter: Payer: Self-pay | Admitting: Family Medicine

## 2014-11-30 ENCOUNTER — Ambulatory Visit (INDEPENDENT_AMBULATORY_CARE_PROVIDER_SITE_OTHER): Payer: Commercial Managed Care - HMO | Admitting: Family Medicine

## 2014-11-30 ENCOUNTER — Encounter: Payer: Self-pay | Admitting: *Deleted

## 2014-11-30 VITALS — BP 110/70 | HR 88 | Temp 98.2°F | Wt 119.2 lb

## 2014-11-30 DIAGNOSIS — M25561 Pain in right knee: Secondary | ICD-10-CM | POA: Diagnosis not present

## 2014-11-30 DIAGNOSIS — F411 Generalized anxiety disorder: Secondary | ICD-10-CM | POA: Diagnosis not present

## 2014-11-30 DIAGNOSIS — G894 Chronic pain syndrome: Secondary | ICD-10-CM

## 2014-11-30 DIAGNOSIS — M5416 Radiculopathy, lumbar region: Secondary | ICD-10-CM

## 2014-11-30 DIAGNOSIS — Z79899 Other long term (current) drug therapy: Secondary | ICD-10-CM | POA: Diagnosis not present

## 2014-11-30 DIAGNOSIS — M25562 Pain in left knee: Secondary | ICD-10-CM

## 2014-11-30 DIAGNOSIS — Z79891 Long term (current) use of opiate analgesic: Secondary | ICD-10-CM | POA: Diagnosis not present

## 2014-11-30 MED ORDER — TRAMADOL HCL 50 MG PO TABS: 100.0000 mg | ORAL_TABLET | Freq: Two times a day (BID) | ORAL | Status: DC | PRN

## 2014-11-30 MED ORDER — PREDNISONE 20 MG PO TABS
ORAL_TABLET | ORAL | Status: DC
Start: 2014-11-30 — End: 2015-02-15

## 2014-11-30 MED ORDER — CLONAZEPAM 1 MG PO TABS: 1.0000 mg | ORAL_TABLET | Freq: Two times a day (BID) | ORAL | Status: DC

## 2014-11-30 MED ORDER — GABAPENTIN 100 MG PO CAPS: 200.0000 mg | ORAL_CAPSULE | Freq: Two times a day (BID) | ORAL | Status: DC

## 2014-11-30 NOTE — Assessment & Plan Note (Signed)
OA vs psoriatic arthritis - increase gabapentin to 200mg  BID. Slow taper given intolerance previously. Will prescribe another short prednisone taper which has significantly helped pain previously. Will appreciate rheum assistance with patient.

## 2014-11-30 NOTE — Progress Notes (Signed)
Pre visit review using our clinic review tool, if applicable. No additional management support is needed unless otherwise documented below in the visit note. 

## 2014-11-30 NOTE — Patient Instructions (Addendum)
Continue klonopin 1mg  twice daily. Increase gabapentin to 2 tablets twice daily Increase tramadol to 2 tablets twice daily Return in 1 month for follow up. UDS (controlled substance agreement form) today.

## 2014-11-30 NOTE — Assessment & Plan Note (Signed)
Chronic pain in h/o drug abuse. Advised I would not prescribe stronger medication than tramadol in his history - we referred to pain clinic last month, still waiting on appointment. His insurance is not accepted by most local pain clinics. Will increase tramadol to 100mg  twice daily.

## 2014-11-30 NOTE — Assessment & Plan Note (Signed)
Doing well with change from xanax to klonopin 1mg  BID. Continue this regimen. Continue trazodone 50-100mg  nightly.

## 2014-11-30 NOTE — Assessment & Plan Note (Addendum)
Increase gabapentin to 200mg  bid. Tolerating slow taper of gabapentin.

## 2014-11-30 NOTE — Progress Notes (Signed)
BP 110/70 mmHg  Pulse 88  Temp(Src) 98.2 F (36.8 C) (Oral)  Wt 119 lb 4 oz (54.091 kg)   CC: 1 mo f/u visit  Subjective:    Patient ID: Jason Rodgers, male    DOB: 1961/05/06, 54 y.o.   MRN: [id number]  HPI: Jason Rodgers is a 54 y.o. male presenting on 11/30/2014 for Follow-up   See prior notes for details - briefly, seen here 5/10 with benzo withdrawal (xanax), led to ER evaluation and stabilization. Returned here for follow up and at that time we transitioned him to clonazepam 1mg  bid.   He is also taking tramadol 100mg  in am and 50mg  in pm, gabapentin 100mg  TID and trazodone 50-100mg  QHS. Tramadol was refilled early last month, pt was advised early refill would not be repeated.   Sleeping well with trazodone nightly.  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 Bad leg and knee pain today. Initially increase in tramadol was helpful for 1 week but pain has again deteriorated. Has 73 yo granddaughter he is unable to play with because of chronic pain specifically at knees.   Pending appointment with rheumatology June 30th.  Awaiting appointment with pain clinic.   Relevant past medical, surgical, family and social history reviewed and updated as indicated. Interim medical history since our last visit reviewed. Allergies and medications reviewed and updated. Current Outpatient Prescriptions on File Prior to Visit  Medication Sig  . traZODone (DESYREL) 50 MG tablet Take 1-2 tablets (50-100 mg total) by mouth at bedtime.   No current facility-administered medications on file prior to visit.   Past Medical History  Diagnosis Date  . History of kidney stones 2000s  . Arthritis   . Psoriasis   . Smoker 2000  . GAD (generalized anxiety disorder)   . Opiate abuse, episodic     per prior PCP records  . History of drug abuse in remission     cocaine, MJ    Past Surgical History  Procedure Laterality Date  . Knee surgery Left 2010    meniscal tear [facility])  .  Kidney stone surgery  2006  . Shoulder surgery Left 04/2004    [facility])   Review of Systems Per HPI unless specifically indicated above     Objective:    BP 110/70 mmHg  Pulse 88  Temp(Src) 98.2 F (36.8 C) (Oral)  Wt 119 lb 4 oz (54.091 kg)  Wt Readings from Last 3 Encounters:  11/30/14 119 lb 4 oz (54.091 kg)  10/28/14 113 lb 12 oz (51.597 kg)  10/26/14 108 lb 8 oz (49.215 kg)    Physical Exam  Constitutional: He appears well-developed and well-nourished. No distress.  Musculoskeletal: He exhibits no edema.  Tender to palpation bilateral knees, mild crepitus without swelling/effusion/erythema.  Skin: Skin is warm and dry. Rash noted.  Scaly plaques throughout skin  Nursing note and vitals reviewed.  Results for orders placed or performed during the hospital encounter of 10/26/14  CBC with Differential/Platelet  Result Value Ref Range   WBC 8.0 4.0 - 10.5 K/uL   RBC 4.78 4.22 - 5.81 MIL/uL   Hemoglobin 14.3 13.0 - 17.0 g/dL   HCT 17.9 15.0 - 56.9 %   MCV 90.2 78.0 - 100.0 fL   MCH 29.9 26.0 - 34.0 pg   MCHC 33.2 30.0 - 36.0 g/dL   RDW 79.4 80.1 - 65.5 %   Platelets 294 150 - 400 K/uL   Neutrophils  Relative % 55 43 - 77 %   Neutro Abs 4.5 1.7 - 7.7 K/uL   Lymphocytes Relative 32 12 - 46 %   Lymphs Abs 2.5 0.7 - 4.0 K/uL   Monocytes Relative 11 3 - 12 %   Monocytes Absolute 0.9 0.1 - 1.0 K/uL   Eosinophils Relative 1 0 - 5 %   Eosinophils Absolute 0.1 0.0 - 0.7 K/uL   Basophils Relative 1 0 - 1 %   Basophils Absolute 0.0 0.0 - 0.1 K/uL  Comprehensive metabolic panel  Result Value Ref Range   Sodium 140 135 - 145 mmol/L   Potassium 3.6 3.5 - 5.1 mmol/L   Chloride 103 101 - 111 mmol/L   CO2 33 (H) 22 - 32 mmol/L   Glucose, Bld 101 (H) 70 - 99 mg/dL   BUN 22 (H) 6 - 20 mg/dL   Creatinine, Ser 1.61 0.61 - 1.24 mg/dL   Calcium 9.4 8.9 - 09.6 mg/dL   Total Protein 7.6 6.5 - 8.1 g/dL   Albumin 4.1 3.5 - 5.0 g/dL   AST 16 15 - 41 U/L   ALT  10 (L) 17 - 63 U/L   Alkaline Phosphatase 62 38 - 126 U/L   Total Bilirubin 0.5 0.3 - 1.2 mg/dL   GFR calc non Af Amer >60 >60 mL/min   GFR calc Af Amer >60 >60 mL/min   Anion gap 4 (L) 5 - 15  Urine rapid drug screen (hosp performed)  Result Value Ref Range   Opiates NONE DETECTED NONE DETECTED   Cocaine NONE DETECTED NONE DETECTED   Benzodiazepines POSITIVE (A) NONE DETECTED   Amphetamines NONE DETECTED NONE DETECTED   Tetrahydrocannabinol NONE DETECTED NONE DETECTED   Barbiturates NONE DETECTED NONE DETECTED  Ethanol  Result Value Ref Range   Alcohol, Ethyl (B) <5 <5 mg/dL  Salicylate level  Result Value Ref Range   Salicylate Lvl <4.0 2.8 - 30.0 mg/dL  Acetaminophen level  Result Value Ref Range   Acetaminophen (Tylenol), Serum <10 (L) 10 - 30 ug/mL      Assessment & Plan:  Controlled substance agreement/ UDS filled out today.  Problem List Items Addressed This Visit    Bilateral knee pain - Primary    OA vs psoriatic arthritis - increase gabapentin to  BID. Slow taper given intolerance previously. Will prescribe another short prednisone taper which has significantly helped pain previously. Will appreciate rheum assistance with patient.      Chronic pain syndrome    Chronic pain in h/o drug abuse. Advised I would not prescribe stronger medication than tramadol in his history - we referred to pain clinic last month, still waiting on appointment. His insurance is not accepted by most local pain clinics. Will increase tramadol to  twice daily.       Generalized anxiety disorder    Doing well with change from xanax to klonopin  BID. Continue this regimen. Continue trazodone 50-100mg  nightly.      Lumbar radiculopathy    Increase gabapentin to  bid. Tolerating slow taper of gabapentin.      Relevant Medications   gabapentin (NEURONTIN) 100 MG capsule   clonazePAM (KLONOPIN) 1 MG tablet       Follow up plan: Return in about 4 weeks (around  12/28/2014), or as needed, for follow up visit.

## 2014-12-07 ENCOUNTER — Encounter: Payer: Self-pay | Admitting: Family Medicine

## 2014-12-14 ENCOUNTER — Ambulatory Visit: Payer: Commercial Managed Care - HMO | Admitting: Family Medicine

## 2014-12-15 ENCOUNTER — Encounter: Payer: Self-pay | Admitting: Family Medicine

## 2014-12-16 DIAGNOSIS — M19072 Primary osteoarthritis, left ankle and foot: Secondary | ICD-10-CM | POA: Diagnosis not present

## 2014-12-16 DIAGNOSIS — M130 Polyarthritis, unspecified: Secondary | ICD-10-CM | POA: Diagnosis not present

## 2014-12-16 DIAGNOSIS — L409 Psoriasis, unspecified: Secondary | ICD-10-CM | POA: Diagnosis not present

## 2014-12-16 DIAGNOSIS — M2550 Pain in unspecified joint: Secondary | ICD-10-CM | POA: Diagnosis not present

## 2014-12-24 ENCOUNTER — Ambulatory Visit: Payer: Commercial Managed Care - HMO | Admitting: Family Medicine

## 2014-12-24 ENCOUNTER — Telehealth: Payer: Self-pay | Admitting: Family Medicine

## 2014-12-24 DIAGNOSIS — Z0289 Encounter for other administrative examinations: Secondary | ICD-10-CM

## 2014-12-24 NOTE — Telephone Encounter (Signed)
Patient did not come for their scheduled appointment today for 1 month follow up Please let me know if the patient needs to be contacted immediately for follow up or if no follow up is necessary.   ° °

## 2014-12-25 NOTE — Telephone Encounter (Signed)
Yes plz call pt for an update on rheum recs and to schedule f/u appt in office.

## 2014-12-27 NOTE — Telephone Encounter (Signed)
Message left for patient to return my call.  

## 2014-12-28 NOTE — Telephone Encounter (Signed)
Message left for patient to return my call.  

## 2014-12-30 ENCOUNTER — Encounter: Payer: Self-pay | Admitting: *Deleted

## 2014-12-30 NOTE — Telephone Encounter (Signed)
Message left for patient to return my call.  Letter mailed

## 2015-01-03 NOTE — Telephone Encounter (Signed)
Pt called back. Stated he finally got his phone turned back on. That is why he could not call us back any sooner. Pt stated he is going to call Dr. Shawnee KnappBeekman's office and get some kind of test set up that Dr. Dierdre ForthBeekman wanted and then call us back once that is complete for a follow up with Dr. Reece AgarG.

## 2015-01-18 ENCOUNTER — Other Ambulatory Visit: Payer: Self-pay

## 2015-01-18 ENCOUNTER — Other Ambulatory Visit: Payer: Self-pay | Admitting: Family Medicine

## 2015-01-18 NOTE — Telephone Encounter (Signed)
Pt left v/m requesting refill clonazepam( rx last printed # 60 on 11/30/14) and tramadol (rx last printed # 120 on 11/30/14.)to [facility]. Pt last seen 11/30/14. Pt also concerned because has not heard back from Dr Anson [place].

## 2015-01-18 NOTE — Telephone Encounter (Signed)
Ok to refill 

## 2015-01-19 NOTE — Telephone Encounter (Signed)
Patient called and said he has been out of his medication for 2 days.  Please call patient.

## 2015-01-20 NOTE — Telephone Encounter (Signed)
Rx's called in as directed.  

## 2015-01-20 NOTE — Telephone Encounter (Signed)
plz phone in and notify patient. 

## 2015-01-20 NOTE — Telephone Encounter (Signed)
plz phone in. 

## 2015-02-03 ENCOUNTER — Other Ambulatory Visit: Payer: Self-pay | Admitting: Family Medicine

## 2015-02-03 NOTE — Telephone Encounter (Signed)
Ok to refill 

## 2015-02-15 ENCOUNTER — Encounter: Payer: Self-pay | Admitting: Family Medicine

## 2015-02-15 ENCOUNTER — Ambulatory Visit (INDEPENDENT_AMBULATORY_CARE_PROVIDER_SITE_OTHER): Payer: Commercial Managed Care - HMO | Admitting: Family Medicine

## 2015-02-15 VITALS — BP 100/60 | HR 92 | Temp 98.0°F | Wt 116.0 lb

## 2015-02-15 DIAGNOSIS — R892 Abnormal level of other drugs, medicaments and biological substances in specimens from other organs, systems and tissues: Secondary | ICD-10-CM | POA: Diagnosis not present

## 2015-02-15 DIAGNOSIS — F411 Generalized anxiety disorder: Secondary | ICD-10-CM | POA: Diagnosis not present

## 2015-02-15 DIAGNOSIS — F17200 Nicotine dependence, unspecified, uncomplicated: Secondary | ICD-10-CM

## 2015-02-15 DIAGNOSIS — Z720 Tobacco use: Secondary | ICD-10-CM

## 2015-02-15 DIAGNOSIS — M79606 Pain in leg, unspecified: Secondary | ICD-10-CM

## 2015-02-15 DIAGNOSIS — G894 Chronic pain syndrome: Secondary | ICD-10-CM

## 2015-02-15 MED ORDER — CLONAZEPAM 1 MG PO TABS: 1.0000 mg | ORAL_TABLET | Freq: Two times a day (BID) | ORAL | Status: DC

## 2015-02-15 MED ORDER — TRAMADOL HCL 50 MG PO TABS: ORAL_TABLET | ORAL | Status: DC

## 2015-02-15 NOTE — Progress Notes (Signed)
   BP 100/60 mmHg  Pulse 92  Temp(Src) 98 F (36.7 C) (Oral)  Wt 116 lb (52.617 kg)   CC: med refill visit  Subjective:    Patient ID: Jason Rodgers, male    DOB: [birth year], 54 y.o.   MRN: [id number]  HPI: Jason Rodgers is a 54 y.o. male presenting on 02/15/2015 for Medication Refill   Last seen here 11/2014 - referred to rheum Dr Dierdre Forth initial visit note reviewed - labs and xrays obtained. States he called back but never received call back to set up labs or follow up.  Taking tramadol  twice daily and klonopin  BID.   Insomnia -  nightly helps with sleeping.  Also on gabapentin  BID.   Feels anxiety stable on current regimen of klonopin  BID. lexapro years ago were helpful. Denies depression/sadness/anhedonia. No SI/HI.   Smoking - no cigarette in last 3 days. Prior 1/2 ppd.   Relevant past medical, surgical, family and social history reviewed and updated as indicated. Interim medical history since our last visit reviewed. Allergies and medications reviewed and updated. Current Outpatient Prescriptions on File Prior to Visit  Medication Sig  . gabapentin (NEURONTIN) 100 MG capsule Take 2 capsules (200 mg total) by mouth 2 (two) times daily.  . traZODone (DESYREL) 50 MG tablet TAKE 1 TO 2 TABLETS BY MOUTH EVERY NIGHTAT BEDTIME   No current facility-administered medications on file prior to visit.    Review of Systems Per HPI unless specifically indicated above     Objective:    BP 100/60 mmHg  Pulse 92  Temp(Src) 98 F (36.7 C) (Oral)  Wt 116 lb (52.617 kg)  Wt Readings from Last 3 Encounters:  02/15/15 116 lb (52.617 kg)  11/30/14 119 lb 4 oz (54.091 kg)  10/28/14 113 lb 12 oz (51.597 kg)    Physical Exam  Constitutional: He appears well-developed and well-nourished. No distress.  thin  HENT:  Mouth/Throat: Oropharynx is clear and moist. No oropharyngeal exudate.  Cardiovascular: Regular rhythm, normal heart sounds and intact distal pulses.   Tachycardia present.   No murmur heard. Pulmonary/Chest: Effort normal and breath sounds normal. No respiratory distress. He has no wheezes. He has no rales.  Musculoskeletal: He exhibits no edema.  Skin: Skin is warm and dry. Rash (psoriatic) noted.  Psychiatric: He has a normal mood and affect.  Nursing note and vitals reviewed.     Assessment & Plan:   Problem List Items Addressed This Visit    Generalized anxiety disorder    Overall stable on klonopin  BID. Continue. Refilled today. Continue trazodone  nightly for insomnia - effective.      Pain in limb    ?psoriatic arthritis - pending completing eval by Dr Dierdre Forth - I asked patient to call their office to f/u - needs f/u labs drawn there.      Smoker    No cigarette in last 3 days. Congratulated and encouraged full cessation.      Chronic pain syndrome - Primary    Continue tramadol  BID. Pt reports compliance with current regimen, states this is effective, denies significant side effects. Will continue. Aware we will not prescribe stronger pain medication due to substance abuse history.      Abnormal drug screen    Rpt uds due next month.          Follow up plan: Return if symptoms worsen or fail to improve.

## 2015-02-15 NOTE — Assessment & Plan Note (Signed)
Continue tramadol  BID. Pt reports compliance with current regimen, states this is effective, denies significant side effects. Will continue. Aware we will not prescribe stronger pain medication due to substance abuse history.

## 2015-02-15 NOTE — Progress Notes (Signed)
Pre visit review using our clinic review tool, if applicable. No additional management support is needed unless otherwise documented below in the visit note. 

## 2015-02-15 NOTE — Patient Instructions (Signed)
Keep working on cutting down and quitting smoking. Call Dr Shawnee Knapp office for follow up labs and appointment. Call me if you can't get through. meds refilled today Return in 3-4 months for follow up visit.

## 2015-02-15 NOTE — Assessment & Plan Note (Signed)
Rpt uds due next month.

## 2015-02-15 NOTE — Assessment & Plan Note (Signed)
?  psoriatic arthritis - pending completing eval by Dr Dierdre Forth - I asked patient to call their office to f/u - needs f/u labs drawn there.

## 2015-02-15 NOTE — Assessment & Plan Note (Signed)
No cigarette in last 3 days. Congratulated and encouraged full cessation.

## 2015-02-15 NOTE — Assessment & Plan Note (Addendum)
Overall stable on klonopin  BID. Continue. Refilled today. Continue trazodone  nightly for insomnia - effective.

## 2015-03-21 ENCOUNTER — Encounter: Payer: Self-pay | Admitting: Family Medicine

## 2015-03-21 ENCOUNTER — Ambulatory Visit (INDEPENDENT_AMBULATORY_CARE_PROVIDER_SITE_OTHER): Payer: Commercial Managed Care - HMO | Admitting: Family Medicine

## 2015-03-21 VITALS — BP 104/64 | HR 80 | Temp 98.1°F | Wt 112.0 lb

## 2015-03-21 DIAGNOSIS — R892 Abnormal level of other drugs, medicaments and biological substances in specimens from other organs, systems and tissues: Secondary | ICD-10-CM

## 2015-03-21 DIAGNOSIS — L409 Psoriasis, unspecified: Secondary | ICD-10-CM | POA: Diagnosis not present

## 2015-03-21 DIAGNOSIS — Z23 Encounter for immunization: Secondary | ICD-10-CM | POA: Diagnosis not present

## 2015-03-21 DIAGNOSIS — M25561 Pain in right knee: Secondary | ICD-10-CM

## 2015-03-21 DIAGNOSIS — Z79891 Long term (current) use of opiate analgesic: Secondary | ICD-10-CM | POA: Diagnosis not present

## 2015-03-21 DIAGNOSIS — G894 Chronic pain syndrome: Secondary | ICD-10-CM

## 2015-03-21 DIAGNOSIS — M25562 Pain in left knee: Secondary | ICD-10-CM

## 2015-03-21 DIAGNOSIS — Z720 Tobacco use: Secondary | ICD-10-CM

## 2015-03-21 DIAGNOSIS — F17200 Nicotine dependence, unspecified, uncomplicated: Secondary | ICD-10-CM

## 2015-03-21 DIAGNOSIS — F411 Generalized anxiety disorder: Secondary | ICD-10-CM

## 2015-03-21 DIAGNOSIS — Z79899 Other long term (current) drug therapy: Secondary | ICD-10-CM | POA: Diagnosis not present

## 2015-03-21 MED ORDER — TRAMADOL HCL 50 MG PO TABS: ORAL_TABLET | ORAL | Status: DC

## 2015-03-21 MED ORDER — CLONAZEPAM 1 MG PO TABS: 1.0000 mg | ORAL_TABLET | Freq: Two times a day (BID) | ORAL | Status: DC

## 2015-03-21 MED ORDER — PREDNISONE 20 MG PO TABS: ORAL_TABLET | ORAL | Status: DC

## 2015-03-21 MED ORDER — CLOBETASOL PROPIONATE 0.05 % EX CREA: 1.0000 "application " | TOPICAL_CREAM | Freq: Two times a day (BID) | CUTANEOUS | Status: DC

## 2015-03-21 NOTE — Assessment & Plan Note (Signed)
Update UDS today - h/o negative tramadol.

## 2015-03-21 NOTE — Assessment & Plan Note (Addendum)
Continue tramadol  BID. Continue gabapentin  QID. Update UDS today.

## 2015-03-21 NOTE — Patient Instructions (Addendum)
Flu shot today. UDS today.  Pass by [facility]'s office to make appointment with Dr Dierdre Forth. I will prescribe another prednisone course for knee pain.  Keep next scheduled appointment.  Stay as active as you can.  I've refilled clobetasol cream.

## 2015-03-21 NOTE — Addendum Note (Signed)
Addended by: Eustaquio Boyden on: 03/21/2015 01:40 PM   Modules accepted: Orders

## 2015-03-21 NOTE — Progress Notes (Addendum)
BP 104/64 mmHg  Pulse 80  Temp(Src) 98.1 F (36.7 C) (Oral)  Wt 112 lb (50.803 kg)   CC: discuss leg pain  Subjective:    Patient ID: Jason Rodgers, male    DOB: 28-Jun-1960, 54 y.o.   MRN: [id number]  HPI: Jason Rodgers is a 54 y.o. male presenting on 03/21/2015 for Leg Pain   Presents with friend Vernona Rieger.   Worsening knee pain over the past month. Denies inciting trauma/injury. Due for UDS today. ?osteoarthritis vs psoriatic arthritis pain. Pending f/u with Dr Dierdre Forth - seen once rec f/u labs but has not been able to schedule f/u visit with them - states he has not received call back despite multiple attempts.  Chronic anxiety - on klonopin  BID as well as trazodone  nightly for insomnia.   For chronic pain, taking tramadol  BID. Also takes gabapentin  BID. I will not prescribe stronger pain medication due to substance abuse history.   Smoking - decreased due to feeling bad.   Relevant past medical, surgical, family and social history reviewed and updated as indicated. Interim medical history since our last visit reviewed. Allergies and medications reviewed and updated. Current Outpatient Prescriptions on File Prior to Visit  Medication Sig  . traZODone (DESYREL) 50 MG tablet TAKE 1 TO 2 TABLETS BY MOUTH EVERY NIGHTAT BEDTIME   No current facility-administered medications on file prior to visit.    Review of Systems Per HPI unless specifically indicated above     Objective:    BP 104/64 mmHg  Pulse 80  Temp(Src) 98.1 F (36.7 C) (Oral)  Wt 112 lb (50.803 kg)  Wt Readings from Last 3 Encounters:  03/21/15 112 lb (50.803 kg)  02/15/15 116 lb (52.617 kg)  11/30/14 119 lb 4 oz (54.091 kg)    Physical Exam  Constitutional: He appears well-developed and well-nourished. No distress.  Musculoskeletal: He exhibits no edema.  Painful ROM of knees but overall preserved ROM. Stiff finger joints. No active synovitis. No effusion ,erythema or warmth of joints.    Skin: Skin is warm and dry. Rash noted.  psoriatric rash present throughout  Psychiatric: His mood appears anxious.  Nursing note and vitals reviewed.      Assessment & Plan:   Problem List Items Addressed This Visit    Smoker    Continue to encourage cessation.      Psoriasis    Refilled clobetasol prn psoriasis.      Relevant Orders   Ambulatory referral to Rheumatology   Generalized anxiety disorder    Continue klonopin  BID and trazodone  nightly.      Chronic pain syndrome    Continue tramadol  BID. Continue gabapentin  QID. Update UDS today.      Bilateral knee pain - Primary    Osteoarthritis vs psoriatic arthritis - pending f/u with Dr Dierdre Forth.  Continue tramadol  bid and gabapentin  QID.  Will prescribe prednisone taper due to presumed arthritis flare - last oral prednisone course was 11/2014. This has significantly helped in the past. I am hesitant to prescribe any stronger narcotic due to substance abuse history.  H/o meniscal tear s/p surgery L knee. I will ask our referral coordinator to expedite f/u appt with rheum.      Relevant Orders   Ambulatory referral to Rheumatology   Abnormal drug screen    Update UDS today - h/o negative tramadol.       Other Visit Diagnoses    Need  for influenza vaccination        Relevant Orders    Flu Vaccine QUAD 36+ mos PF IM (Fluarix & Fluzone Quad PF) (Completed)        Follow up plan: No Follow-up on file.

## 2015-03-21 NOTE — Assessment & Plan Note (Addendum)
Osteoarthritis vs psoriatic arthritis - pending f/u with Dr Dierdre Forth.  Continue tramadol  bid and gabapentin  QID.  Will prescribe prednisone taper due to presumed arthritis flare - last oral prednisone course was 11/2014. This has significantly helped in the past. I am hesitant to prescribe any stronger narcotic due to substance abuse history.  H/o meniscal tear s/p surgery L knee. I will ask our referral coordinator to expedite f/u appt with rheum.

## 2015-03-21 NOTE — Assessment & Plan Note (Signed)
Continue klonopin  BID and trazodone  nightly.

## 2015-03-21 NOTE — Assessment & Plan Note (Signed)
Continue to encourage cessation. 

## 2015-03-21 NOTE — Assessment & Plan Note (Signed)
Refilled clobetasol prn psoriasis.

## 2015-03-21 NOTE — Progress Notes (Signed)
Pre visit review using our clinic review tool, if applicable. No additional management support is needed unless otherwise documented below in the visit note. 

## 2015-03-29 DIAGNOSIS — L409 Psoriasis, unspecified: Secondary | ICD-10-CM | POA: Diagnosis not present

## 2015-03-29 DIAGNOSIS — M2550 Pain in unspecified joint: Secondary | ICD-10-CM | POA: Diagnosis not present

## 2015-03-29 DIAGNOSIS — G629 Polyneuropathy, unspecified: Secondary | ICD-10-CM | POA: Diagnosis not present

## 2015-04-13 ENCOUNTER — Encounter: Payer: Self-pay | Admitting: Family Medicine

## 2015-04-13 ENCOUNTER — Telehealth: Payer: Self-pay | Admitting: Family Medicine

## 2015-04-13 DIAGNOSIS — R892 Abnormal level of other drugs, medicaments and biological substances in specimens from other organs, systems and tissues: Secondary | ICD-10-CM

## 2015-04-13 NOTE — Telephone Encounter (Signed)
Spoke with patient re abnormal UDS positive for cocaine and oxycodone/oxymorphone. Appropriately positive klonopin, gabapentin, tramadol.  Pt denies taking oxycodone/cocaine. Advised I cannot continue prescribing controlled substances with positive cocaine. Advised I will start taper off tramadol and klonopin.  Recommended he call to establish with substance abuse counselor.  Pt will return to see me for f/u in 2-3 wks. Kim plz call and give info on [facility] to establish there, ok to leave message per patient.

## 2015-04-19 ENCOUNTER — Other Ambulatory Visit: Payer: Self-pay | Admitting: Family Medicine

## 2015-04-19 NOTE — Telephone Encounter (Signed)
Ok to refill? Needs by Thursday due to mother having surgery and he is helping with her care.

## 2015-04-19 NOTE — Telephone Encounter (Signed)
Patient given info and follow up scheduled.

## 2015-04-20 NOTE — Telephone Encounter (Signed)
Rx's called in as directed.  

## 2015-04-20 NOTE — Telephone Encounter (Signed)
plz phone in. Will start slow taper given abnormal UDS.

## 2015-04-22 ENCOUNTER — Encounter: Payer: Self-pay | Admitting: Family Medicine

## 2015-05-09 ENCOUNTER — Ambulatory Visit: Payer: Commercial Managed Care - HMO | Admitting: Family Medicine

## 2015-05-26 ENCOUNTER — Other Ambulatory Visit: Payer: Self-pay | Admitting: Family Medicine

## 2015-05-26 NOTE — Telephone Encounter (Deleted)
Ok to refill 

## 2015-05-26 NOTE — Telephone Encounter (Signed)
Pt left v/m requesting refill clonazepam(last refilled # 90 on 04/20/15) and tramadol(last refilled # 90 on 04/20/15) to [facility]; last seen 03/21/15.

## 2015-05-26 NOTE — Telephone Encounter (Signed)
Will continue taper off controlled substances given +cocaine on UDS.  Continue klonopin 0.5mg  TID, decrease tramadol to 50mg  BID. plz phone in.

## 2015-05-27 NOTE — Telephone Encounter (Signed)
Rx's called in as directed.  

## 2015-06-10 ENCOUNTER — Emergency Department: Payer: Commercial Managed Care - HMO

## 2015-06-10 ENCOUNTER — Encounter: Payer: Self-pay | Admitting: Emergency Medicine

## 2015-06-10 ENCOUNTER — Emergency Department
Admission: EM | Admit: 2015-06-10 | Discharge: 2015-06-10 | Disposition: A | Discharge status: Home / Self Care | Payer: Commercial Managed Care - HMO | Attending: Emergency Medicine | Admitting: Emergency Medicine

## 2015-06-10 DIAGNOSIS — G8929 Other chronic pain: Secondary | ICD-10-CM | POA: Insufficient documentation

## 2015-06-10 DIAGNOSIS — F17210 Nicotine dependence, cigarettes, uncomplicated: Secondary | ICD-10-CM | POA: Insufficient documentation

## 2015-06-10 DIAGNOSIS — F419 Anxiety disorder, unspecified: Secondary | ICD-10-CM | POA: Diagnosis not present

## 2015-06-10 DIAGNOSIS — M25512 Pain in left shoulder: Secondary | ICD-10-CM | POA: Diagnosis not present

## 2015-06-10 DIAGNOSIS — Z79899 Other long term (current) drug therapy: Secondary | ICD-10-CM | POA: Insufficient documentation

## 2015-06-10 HISTORY — DX: Polyneuropathy, unspecified: G62.9

## 2015-06-10 MED ORDER — KETOROLAC TROMETHAMINE 60 MG/2ML IM SOLN
60.0000 mg | Freq: Once | INTRAMUSCULAR | Status: AC
  Administered 2015-06-10: 60 mg via INTRAMUSCULAR
  Filled 2015-06-10: qty 2

## 2015-06-10 NOTE — ED Provider Notes (Signed)
[facility] Emergency Department Provider Note  ____________________________________________  Time seen: Approximately 10:19 PM  I have reviewed the triage vital signs and the nursing notes.   HISTORY  Chief Complaint Shoulder Pain    HPI Jason Rodgers is a 54 y.o. male patient complaining of left shoulder pain. Patient is worsening the past 2 weeks. Patient stated no surgery 2005 by [facility]. Patient stated  intermittent pain since the surgery.  Patient placed on disability secondary limitation from the surgery.Patient believe he has internal fixation which have come loose in the shoulder. She is rating his pain as a 8/10. Patient describes pain as sharp. Patient records shows he receives 90-120 tramadol's per month for pain. Patient is wearing an arm sling.   Past Medical History  Diagnosis Date  . History of kidney stones 2000s  . Arthritis   . Psoriasis   . Smoker 2000  . GAD (generalized anxiety disorder)   . Opiate abuse, episodic     per prior PCP records  . History of drug abuse in remission     cocaine, MJ  . Abnormal drug screen 11/2014    inapprop neg tramadol rpt 3 mo (11/2014); inapprop positive oxycodone and cocaine - will taper off controlled substances (03/2015)  . Neuropathy [facility](HCC)     Patient Active Problem List   Diagnosis Date Noted  . Abnormal drug screen 11/17/2014  . Chronic pain syndrome 10/28/2014  . Lumbar radiculopathy 09/23/2014  . Psoriasis   . Pain in limb 09/13/2014  . Smoker 09/13/2014  . Bilateral knee pain 08/03/2013  . Generalized anxiety disorder 08/03/2013    Past Surgical History  Procedure Laterality Date  . Knee surgery Left 2010    meniscal tear [facility])  . Kidney stone surgery  2006  . Shoulder surgery Left 04/2004    [facility])    Current Outpatient Rx  Name  Route  Sig  Dispense  Refill  . clonazePAM (KLONOPIN) 0.5 MG tablet   Oral   Take 1 tablet (0.5 mg  total) by mouth 3 (three) times daily.   90 tablet   0   . gabapentin (NEURONTIN) 100 MG capsule   Oral   Take 1 capsule (100 mg total) by mouth 4 (four) times daily.         . pregabalin (LYRICA) 150 MG capsule   Oral   Take 150 mg by mouth daily.         . traMADol (ULTRAM) 50 MG tablet   Oral   Take 1 tablet (50 mg total) by mouth 2 (two) times daily. Patient taking differently: Take 100 mg by mouth 2 (two) times daily.    60 tablet   0     NEW SIG   . traZODone (DESYREL) 50 MG tablet      TAKE 1 TO 2 TABLETS BY MOUTH EVERY NIGHTAT BEDTIME   30 tablet   11   . clobetasol cream (TEMOVATE) 0.05 %   Topical   Apply 1 application topically 2 (two) times daily. Apply to AA   30 g   0   . predniSONE (DELTASONE) 20 MG tablet      Take one tablet twice daily for 4 days then one tablet once daily for 4 days   12 tablet   0     Allergies Gabapentin and Lyrica  Family History  Problem Relation Age of Onset  . Arthritis Mother   . Hypertension Mother   .  Arthritis Father   . CAD Father     MI  . Arthritis Paternal Grandmother   . Diabetes Neg Hx   . Cancer Maternal Aunt     breast  . Stroke Neg Hx     Social History Social History  Substance Use Topics  . Smoking status: Current Every Day Smoker -- 0.50 packs/day    Types: Cigarettes  . Smokeless tobacco: Never Used  . Alcohol Use: No    Review of Systems Constitutional: No fever/chills Eyes: No visual changes. ENT: No sore throat. Cardiovascular: Denies chest pain. Respiratory: Denies shortness of breath. Gastrointestinal: No abdominal pain.  No nausea, no vomiting.  No diarrhea.  No constipation. Genitourinary: Negative for dysuria. Musculoskeletal: Chronic left shoulder pain. Skin: Negative for rash. Neurological: Negative for headaches, focal weakness or numbness. Psychiatric:Anxiety Allergic/Immunilogical: The medication list  10-point ROS otherwise  negative.  ____________________________________________   PHYSICAL EXAM:  VITAL SIGNS: ED Triage Vitals  Enc Vitals Group     BP 06/10/15 2202 124/82 mmHg     Pulse Rate 06/10/15 2202 90     Resp 06/10/15 2202 16     Temp 06/10/15 2203 97.7 F (36.5 C)     Temp Source 06/10/15 2203 Oral     SpO2 06/10/15 2202 96 %     Weight 06/10/15 2202 125 lb (56.7 kg)     Height 06/10/15 2202  (1.727 m)     Head Cir --      Peak Flow --      Pain Score 06/10/15 2203 8     Pain Loc --      Pain Edu? --      Excl. in GC? --     Constitutional: Alert and oriented. Well appearing and in no acute distress. Eyes: Conjunctivae are normal. PERRL. EOMI. Head: Atraumatic. Nose: No congestion/rhinnorhea. Mouth/Throat: Mucous membranes are moist.  Oropharynx non-erythematous. Neck: No stridor.  No cervical spine tenderness to palpation. Hematological/Lymphatic/Immunilogical: No cervical lymphadenopathy. Cardiovascular: Normal rate, regular rhythm. Grossly normal heart sounds.  Good peripheral circulation. Respiratory: Normal respiratory effort.  No retractions. Lungs CTAB. Gastrointestinal: Soft and nontender. No distention. No abdominal bruits. No CVA tenderness.   **}Musculoskeletal: No lower extremity tenderness nor edema.  No joint effusions. Neurologic:  Normal speech and language. No gross focal neurologic deficits are appreciated. No gait instability. Skin:  Skin is warm, dry and intact. No rash noted. Psychiatric: Mood and affect are normal. Speech and behavior are normal.  ____________________________________________   LABS (all labs ordered are listed, but only abnormal results are displayed)  Labs Reviewed - No data to display ____________________________________________  EKG   ____________________________________________  RADIOLOGY  No acute finding on x-ray. ____________________________________________   PROCEDURES  Procedure(s) performed: None  Critical  Care performed: No  ____________________________________________   INITIAL IMPRESSION / ASSESSMENT AND PLAN / ED COURSE  Pertinent labs & imaging results that were available during my care of the patient were reviewed by me and considered in my medical decision making (see chart for details).  Left shoulder pain. Discuss x-ray finding with patient.  No Narcotic medication since currently taking Tramadol.  Advised to follow up with treating Ortho Doctor for stronger pain medications. ____________________________________________   FINAL CLINICAL IMPRESSION(S) / ED DIAGNOSES  Final diagnoses:  Chronic shoulder pain, left      Joni Reining, PA-C 06/10/15 2318  Joni Reining, PA-C 06/10/15 2326  Minna Antis, MD 06/13/15 [phone]

## 2015-06-10 NOTE — ED Notes (Signed)
Patient with no complaints at this time. Respirations even and unlabored. Skin warm/dry. Discharge instructions reviewed with patient at this time. Patient given opportunity to voice concerns/ask questions. Patient discharged at this time and left Emergency Department with steady gait.   

## 2015-06-10 NOTE — ED Notes (Signed)
Pt states left shoulder pain since 2005 intermittently. Pt states pain to left shoulder is worse over last 2 weeks. Pt with cms intact to left fingers. Skin pwd, pt denies chest pain.

## 2015-06-10 NOTE — Discharge Instructions (Signed)
Follow up with treating Ortho Doctor  for stronger pain medications.

## 2015-06-10 NOTE — ED Notes (Signed)
Pt states pain is worse when moving arm and shoulder on left side. Pt is currently wearing a sling.

## 2015-06-10 NOTE — ED Notes (Signed)
C/O left shoulder pain.  Onset of symptoms 2 weeks.  States has history of surgery to left shoulder, by [facility] Dr. Lyman BishopSilver.

## 2015-06-19 HISTORY — PX: OTHER SURGICAL HISTORY: SHX169

## 2015-06-21 DIAGNOSIS — M25512 Pain in left shoulder: Secondary | ICD-10-CM | POA: Diagnosis not present

## 2015-06-21 DIAGNOSIS — M25562 Pain in left knee: Secondary | ICD-10-CM | POA: Diagnosis not present

## 2015-06-27 ENCOUNTER — Ambulatory Visit: Payer: Commercial Managed Care - HMO | Admitting: Family Medicine

## 2015-06-27 ENCOUNTER — Other Ambulatory Visit: Payer: Self-pay | Admitting: Family Medicine

## 2015-06-27 ENCOUNTER — Telehealth: Payer: Self-pay | Admitting: Family Medicine

## 2015-06-27 MED ORDER — CLONAZEPAM 0.5 MG PO TABS: 0.5000 mg | ORAL_TABLET | Freq: Two times a day (BID) | ORAL | Status: DC

## 2015-06-27 MED ORDER — TRAMADOL HCL 50 MG PO TABS: 50.0000 mg | ORAL_TABLET | Freq: Two times a day (BID) | ORAL | Status: DC

## 2015-06-27 NOTE — Telephone Encounter (Signed)
Will continue taper off controlled substances given +cocaine on UDS 03/2015.  Decrease klonopin to 0.5mg  BID, continue tramadol to 50mg  BID. plz phone in. Recommend establish with substance abuse counselor. Schedule f/u appt after ortho appt.

## 2015-06-27 NOTE — Telephone Encounter (Signed)
Pt called stating that he cannot get out of his house due to the weather. He has been trying to get an appt with [facility] in [place] for his shoulder/arm pain (they did his surgery 10 years ago). He said he didn't know if Dr. Sharen HonesGutierrez would want to see him after that appt or before. So he cancelled for today and will reschedule once he knows what he should do. The appt for today was for a rx refill. He did state that he took his last Clonazepam today. He said his son is working on [place] today and could pick up his rx for him today so he doesn't go without. You can reach pt at [phone].

## 2015-06-28 ENCOUNTER — Other Ambulatory Visit: Payer: Self-pay | Admitting: Family Medicine

## 2015-06-28 NOTE — Telephone Encounter (Signed)
Klonopin request see yesterday's phone note

## 2015-06-28 NOTE — Telephone Encounter (Signed)
Rx's called in as directed.  

## 2015-07-01 DIAGNOSIS — M25512 Pain in left shoulder: Secondary | ICD-10-CM | POA: Diagnosis not present

## 2015-07-05 DIAGNOSIS — M5412 Radiculopathy, cervical region: Secondary | ICD-10-CM | POA: Diagnosis not present

## 2015-07-16 ENCOUNTER — Encounter: Payer: Self-pay | Admitting: Family Medicine

## 2015-07-16 DIAGNOSIS — M25512 Pain in left shoulder: Secondary | ICD-10-CM

## 2015-07-16 DIAGNOSIS — M5010 Cervical disc disorder with radiculopathy, unspecified cervical region: Secondary | ICD-10-CM | POA: Insufficient documentation

## 2015-07-16 DIAGNOSIS — G8929 Other chronic pain: Secondary | ICD-10-CM | POA: Insufficient documentation

## 2015-07-20 ENCOUNTER — Other Ambulatory Visit: Payer: Self-pay | Admitting: Family Medicine

## 2015-07-20 ENCOUNTER — Telehealth: Payer: Self-pay

## 2015-07-20 NOTE — Telephone Encounter (Signed)
Pt request refill trazodone to [facility] pharmacy; spoke with Tawanna Cooler at [facility] and pt has available refill of trazodone; Tawanna Cooler will get ready for pick up; pt notified and pt will keep appt with Dr Reece Agar on 07/22/15.

## 2015-07-21 NOTE — Telephone Encounter (Signed)
Ok to refill? Appt tomorrow for med refill.

## 2015-07-21 NOTE — Telephone Encounter (Signed)
Too early. Will discuss tomorrow.

## 2015-07-22 ENCOUNTER — Ambulatory Visit: Payer: Commercial Managed Care - HMO | Admitting: Family Medicine

## 2015-07-23 DIAGNOSIS — M5412 Radiculopathy, cervical region: Secondary | ICD-10-CM | POA: Diagnosis not present

## 2015-07-25 ENCOUNTER — Encounter: Payer: Self-pay | Admitting: Family Medicine

## 2015-07-25 ENCOUNTER — Ambulatory Visit (INDEPENDENT_AMBULATORY_CARE_PROVIDER_SITE_OTHER): Payer: Commercial Managed Care - HMO | Admitting: Family Medicine

## 2015-07-25 VITALS — BP 100/70 | HR 84 | Temp 98.0°F | Wt 128.5 lb

## 2015-07-25 DIAGNOSIS — F411 Generalized anxiety disorder: Secondary | ICD-10-CM | POA: Diagnosis not present

## 2015-07-25 DIAGNOSIS — G894 Chronic pain syndrome: Secondary | ICD-10-CM

## 2015-07-25 DIAGNOSIS — M5010 Cervical disc disorder with radiculopathy, unspecified cervical region: Secondary | ICD-10-CM

## 2015-07-25 DIAGNOSIS — R892 Abnormal level of other drugs, medicaments and biological substances in specimens from other organs, systems and tissues: Secondary | ICD-10-CM | POA: Diagnosis not present

## 2015-07-25 MED ORDER — TRAZODONE HCL 50 MG PO TABS: 100.0000 mg | ORAL_TABLET | Freq: Every day | ORAL | Status: DC

## 2015-07-25 MED ORDER — CLONAZEPAM 0.5 MG PO TABS: 0.5000 mg | ORAL_TABLET | Freq: Two times a day (BID) | ORAL | Status: DC

## 2015-07-25 MED ORDER — PREGABALIN 150 MG PO CAPS: 150.0000 mg | ORAL_CAPSULE | Freq: Two times a day (BID) | ORAL | Status: DC

## 2015-07-25 MED ORDER — TRAZODONE HCL 100 MG PO TABS: 100.0000 mg | ORAL_TABLET | Freq: Every day | ORAL | Status: DC

## 2015-07-25 NOTE — Assessment & Plan Note (Addendum)
Reviewed recent ortho evaluation.  Pending cervical MRI and f/u with neurosurgeon.

## 2015-07-25 NOTE — Patient Instructions (Addendum)
Stop gabapentin and tramadol.  Continue lyrica - higher dose sent in today. I'm glad it's working better for leg pain.  I've refilled klonopin - ok to fill early tomorrow - but take max twice daily. We are working on slow taper off this controlled substance.

## 2015-07-25 NOTE — Progress Notes (Signed)
Pre visit review using our clinic review tool, if applicable. No additional management support is needed unless otherwise documented below in the visit note. 

## 2015-07-25 NOTE — Progress Notes (Signed)
BP 100/70 mmHg  Pulse 84  Temp(Src) 98 F (36.7 C) (Oral)  Wt 128 lb 8 oz (58.287 kg)   CC: med refill  Subjective:    Patient ID: Jason Rodgers, male    DOB: 1960-06-25, 55 y.o.   MRN: [id number]  HPI: Jason Rodgers is a 55 y.o. male presenting on 07/25/2015 for Medication Refill   See prior notes for details.  Tested positive for cocaine and inappropriately positive for oxycodone 03/2015. We have been on prolonged taper off klonopin and tramadol given prior taper caused withdrawal necessitating ER visit. Currently on clonazepam 0.5mg  BID last filled #60 06/27/2015 and traamdol  BID last filled #60 06/27/2015.   Last took tramadol >1 wk ago. lyrica works better for him.  Has been seeing Dr Charlett Nose orthopedic in [place]. Shoulder looking ok. Found to have cervical disc disease discussing surgery. To see Dr Aundria Rud spine specialist. Treating with prednisone course and flexeril. Has seen rheumatologist.   Relevant past medical, surgical, family and social history reviewed and updated as indicated. Interim medical history since our last visit reviewed. Allergies and medications reviewed and updated. Current Outpatient Prescriptions on File Prior to Visit  Medication Sig  . clobetasol cream (TEMOVATE) 0.05 % Apply 1 application topically 2 (two) times daily. Apply to AA (Patient not taking: Reported on 07/25/2015)   No current facility-administered medications on file prior to visit.   Past Medical History  Diagnosis Date  . History of kidney stones 2000s  . Arthritis   . Psoriasis   . Smoker 2000  . GAD (generalized anxiety disorder)   . Opiate abuse, episodic     per prior PCP records  . History of drug abuse in remission     cocaine, MJ  . Abnormal drug screen 11/2014    inapprop neg tramadol rpt 3 mo (11/2014); inapprop positive oxycodone and cocaine - will taper off controlled substances (03/2015)  . Neuropathy (HCC)     Review of Systems Per HPI unless specifically  indicated in ROS section     Objective:    BP 100/70 mmHg  Pulse 84  Temp(Src) 98 F (36.7 C) (Oral)  Wt 128 lb 8 oz (58.287 kg)  Wt Readings from Last 3 Encounters:  07/25/15 128 lb 8 oz (58.287 kg)  06/10/15 125 lb (56.7 kg)  03/21/15 112 lb (50.803 kg)    Physical Exam  Constitutional: He appears well-developed and well-nourished. No distress.  HENT:  Mouth/Throat: Oropharynx is clear and moist. No oropharyngeal exudate.  Cardiovascular: Normal rate, regular rhythm, normal heart sounds and intact distal pulses.   No murmur heard. Pulmonary/Chest: Effort normal and breath sounds normal. No respiratory distress. He has no wheezes. He has no rales.  Musculoskeletal: He exhibits no edema.  Holds L arm stiffly near his side on exam but then able to freely move BUE to lift pant legs.  Skin:  Psoriatic rash on dorsal hands  Psychiatric: His mood appears anxious.  Nursing note and vitals reviewed.     Assessment & Plan:   Problem List Items Addressed This Visit    Generalized anxiety disorder    Requested increased klonopin dose - I advised against this and again reviewed goal of complete taper off controlled substances given recent abnormal UDS. Continue 0.5mg  BID dose for another month then continue slowed taper.      Chronic pain syndrome    Now off tramadol for last several weeks. States lyrica  BID working much better for  him - refilled today and coupon provided. Tolerating lyrica without rash.      Cervical disc disorder with radiculopathy of cervical region - Primary    Reviewed recent ortho evaluation.  Pending cervical MRI and f/u with neurosurgeon.       Abnormal drug screen       Follow up plan: Return in about 3 months (around 10/22/2015), or as needed, for follow up visit.

## 2015-07-25 NOTE — Assessment & Plan Note (Signed)
Requested increased klonopin dose - I advised against this and again reviewed goal of complete taper off controlled substances given recent abnormal UDS. Continue 0.5mg  BID dose for another month then continue slowed taper.

## 2015-07-25 NOTE — Assessment & Plan Note (Addendum)
Now off tramadol for last several weeks. States lyrica  BID working much better for him - refilled today and coupon provided. Tolerating lyrica without rash.

## 2015-07-26 DIAGNOSIS — M4802 Spinal stenosis, cervical region: Secondary | ICD-10-CM | POA: Diagnosis not present

## 2015-08-08 DIAGNOSIS — M5412 Radiculopathy, cervical region: Secondary | ICD-10-CM | POA: Diagnosis not present

## 2015-08-18 ENCOUNTER — Other Ambulatory Visit: Payer: Self-pay

## 2015-08-18 NOTE — Telephone Encounter (Signed)
Pt left v/m requesting klonopin refill to [facility]; last seen and last refill # 60 on 07/25/15.Please advise.

## 2015-08-19 MED ORDER — CLONAZEPAM 0.5 MG PO TABS: 0.5000 mg | ORAL_TABLET | Freq: Two times a day (BID) | ORAL | Status: DC

## 2015-08-19 NOTE — Telephone Encounter (Signed)
plz phone in. 

## 2015-08-19 NOTE — Telephone Encounter (Signed)
Rx called in as directed.   

## 2015-09-16 ENCOUNTER — Other Ambulatory Visit: Payer: Self-pay | Admitting: Family Medicine

## 2015-09-16 ENCOUNTER — Other Ambulatory Visit: Payer: Self-pay | Admitting: *Deleted

## 2015-09-16 NOTE — Telephone Encounter (Signed)
Ok to refill 

## 2015-09-16 NOTE — Telephone Encounter (Signed)
Phoned in. Will continue slow taper. 1/2 tab in am and 1 tab at night.

## 2015-09-16 NOTE — Telephone Encounter (Signed)
Last f/u 07/2015, and last filled 08/19/15. Pt advised of 2 business day turn around on all Rx refills

## 2015-10-06 DIAGNOSIS — M25512 Pain in left shoulder: Secondary | ICD-10-CM | POA: Diagnosis not present

## 2015-10-24 ENCOUNTER — Other Ambulatory Visit: Payer: Self-pay

## 2015-10-24 MED ORDER — CLONAZEPAM 0.5 MG PO TABS: 0.2500 mg | ORAL_TABLET | Freq: Two times a day (BID) | ORAL | Status: DC

## 2015-10-24 NOTE — Telephone Encounter (Signed)
Pt left v/m requesting refill clonazepam to [facility]. Last refilled # 50 on 09/16/15; pt last seen 07/25/15.pt request cb when refilled.

## 2015-10-24 NOTE — Telephone Encounter (Signed)
Will continue slow taper. plz phone in.

## 2015-10-25 NOTE — Telephone Encounter (Signed)
Rx called in as directed.   

## 2015-11-28 ENCOUNTER — Other Ambulatory Visit: Payer: Self-pay

## 2015-11-28 NOTE — Telephone Encounter (Signed)
Pt left v/m requesting refill clonazepam to [facility]. Last refilled # 40 on 10/24/15 and pt last seen 07/25/15.Please advise.

## 2015-11-29 ENCOUNTER — Other Ambulatory Visit: Payer: Self-pay | Admitting: Family Medicine

## 2015-11-29 NOTE — Telephone Encounter (Signed)
Ok to refill 

## 2015-11-30 NOTE — Telephone Encounter (Signed)
See today's other refill request note.

## 2015-11-30 NOTE — Telephone Encounter (Signed)
plz phone in. Next month will continue taper.

## 2015-11-30 NOTE — Telephone Encounter (Signed)
Rx called in as directed.   

## 2015-11-30 NOTE — Telephone Encounter (Signed)
Patient called and said he's out of medication since Monday and would like to know if he needs to make an appointment to get the medication.  [facility].

## 2015-12-21 ENCOUNTER — Ambulatory Visit: Payer: Commercial Managed Care - HMO | Admitting: Neurology

## 2015-12-21 DIAGNOSIS — Z029 Encounter for administrative examinations, unspecified: Secondary | ICD-10-CM

## 2015-12-29 ENCOUNTER — Other Ambulatory Visit: Payer: Self-pay

## 2015-12-29 MED ORDER — CLONAZEPAM 0.5 MG PO TABS: ORAL_TABLET | ORAL | Status: DC

## 2015-12-29 NOTE — Telephone Encounter (Signed)
Pt left /vm requesting [facility]. Last refilled # 30 on 11/30/15; last seen 07/25/15.

## 2015-12-29 NOTE — Telephone Encounter (Signed)
Will continue taper. plz phone in lower dose.

## 2015-12-30 NOTE — Telephone Encounter (Signed)
[facility]

## 2016-01-30 ENCOUNTER — Ambulatory Visit (INDEPENDENT_AMBULATORY_CARE_PROVIDER_SITE_OTHER): Payer: Commercial Managed Care - HMO | Admitting: Family Medicine

## 2016-01-30 ENCOUNTER — Encounter: Payer: Self-pay | Admitting: Family Medicine

## 2016-01-30 VITALS — BP 100/70 | HR 109 | Wt 125.0 lb

## 2016-01-30 DIAGNOSIS — M5010 Cervical disc disorder with radiculopathy, unspecified cervical region: Secondary | ICD-10-CM | POA: Diagnosis not present

## 2016-01-30 DIAGNOSIS — F411 Generalized anxiety disorder: Secondary | ICD-10-CM | POA: Diagnosis not present

## 2016-01-30 DIAGNOSIS — L409 Psoriasis, unspecified: Secondary | ICD-10-CM | POA: Diagnosis not present

## 2016-01-30 MED ORDER — TRAZODONE HCL 100 MG PO TABS: 100.0000 mg | ORAL_TABLET | Freq: Every day | ORAL | 11 refills | Status: DC

## 2016-01-30 MED ORDER — PREGABALIN 150 MG PO CAPS: 150.0000 mg | ORAL_CAPSULE | Freq: Two times a day (BID) | ORAL | 10 refills | Status: DC

## 2016-01-30 MED ORDER — CLOBETASOL PROPIONATE 0.05 % EX CREA: 1.0000 "application " | TOPICAL_CREAM | Freq: Two times a day (BID) | CUTANEOUS | 0 refills | Status: DC

## 2016-01-30 MED ORDER — BUSPIRONE HCL 5 MG PO TABS: 5.0000 mg | ORAL_TABLET | Freq: Two times a day (BID) | ORAL | 0 refills | Status: DC

## 2016-01-30 NOTE — Assessment & Plan Note (Addendum)
Continue lyrica 150mg  BID. Has f/u with [facility] scheduled.

## 2016-01-30 NOTE — Patient Instructions (Signed)
Start buspar 5mg  nightly for 4 days then increase to twice daily.  I've printed out prescription for clobetasol cream.

## 2016-01-30 NOTE — Progress Notes (Signed)
Pre visit review using our clinic review tool, if applicable. No additional management support is needed unless otherwise documented below in the visit note. 

## 2016-01-30 NOTE — Assessment & Plan Note (Signed)
Last clonazepam 0.5mg  Rx filled #20 12/29/2015. Now out of medication.  Discussed buspar vs cymbalta. Pt worried about cost.  Will start buspar 5mg  daily x 4 days then increase to 5mg  bid.  RTC 1 mo f/u visit.

## 2016-01-30 NOTE — Progress Notes (Signed)
   BP 100/70   Pulse (!) 109   Wt 125 lb (56.7 kg)   SpO2 97%   BMI 19.01 kg/m    CC: med management Subjective:    Patient ID: Jason Rodgers, male    DOB: 07/21/1960, 55 y.o.   MRN: [id number]  HPI: Jason RiisRandy W Fahmy is a 55 y.o. male presenting on 01/30/2016 for Medication Management   Last seen here 07/2015.   Tested positive for cocaine and inappropriately positive for oxycodone 03/2015. We have been on prolonged taper off klonopin and tramadol given prior taper caused withdrawal necessitating ER visit. Currently on clonazepam 0.5mg  1/2 tab daily PRN last filled #20 12/29/2015.   [facility] been [facility] [place]. Shoulder looking ok. Found to have cervical disc disease discussing surgery. To see Dr Aundria Rudogers spine specialist. Has also seen rheumatologist. Seeing Dr Waymon BudgeMusante s/p cervical ESI. Placed on Norco 10/325mg  by ortho x3 months.   Denies alcohol or cocaine or marijuana use.   Relevant past medical, surgical, family and social history reviewed and updated as indicated. Interim medical history since our last visit reviewed. Allergies and medications reviewed and updated. No current outpatient prescriptions on file prior to visit.   No current facility-administered medications on file prior to visit.     Review of Systems Per HPI unless specifically indicated in ROS section     Objective:    BP 100/70   Pulse (!) 109   Wt 125 lb (56.7 kg)   SpO2 97%   BMI 19.01 kg/m   Wt Readings from Last 3 Encounters:  01/30/16 125 lb (56.7 kg)  07/25/15 128 lb 8 oz (58.3 kg)  06/10/15 125 lb (56.7 kg)    Physical Exam  Constitutional: He is oriented to person, place, and time. He appears well-developed and well-nourished. No distress.  HENT:  Mouth/Throat: Oropharynx is clear and moist. No oropharyngeal exudate.  Musculoskeletal: He exhibits no edema.  Neurological: He is alert and oriented to person, place, and time.  Skin: Skin is warm and dry. No rash noted.    Psychiatric: His behavior is normal. His mood appears anxious.  Nursing note and vitals reviewed.      Assessment & Plan:   Problem List Items Addressed This Visit    Cervical disc disorder with radiculopathy of cervical region    Continue lyrica 150mg  BID. Has f/u with [facility] scheduled.       Generalized anxiety disorder - Primary    Last clonazepam 0.5mg  Rx filled #20 12/29/2015. Now out of medication.  Discussed buspar vs cymbalta. Pt worried about cost.  Will start buspar 5mg  daily x 4 days then increase to 5mg  bid.  RTC 1 mo f/u visit.       Psoriasis    Rx for clobetasol printed today.        Other Visit Diagnoses   None.      Follow up plan: Return in about 4 weeks (around 02/27/2016), or as needed, for follow up visit.  Eustaquio BoydenJavier Coletta Lockner, MD

## 2016-01-30 NOTE — Assessment & Plan Note (Signed)
Rx for clobetasol printed today.

## 2016-02-08 ENCOUNTER — Telehealth: Payer: Self-pay

## 2016-02-08 NOTE — Telephone Encounter (Signed)
Pt started Buspar on 01/30/16 and now taking Buspar 5 mg bid. Pt is having vision problems; blurred vision; difficult to see and pupils are very small. No difficulty breathing or swelling. Pt thinks Buspar is causing problem.Pt request cb ASAP. [place].

## 2016-02-08 NOTE — Telephone Encounter (Signed)
Blurred vision can be a side effect.  Recommend decrease to 1 tablet daily x 1 more week then try BID dosing again - body may just need time to adjust to new med.  Let us know if ongoing blurred vision.

## 2016-02-08 NOTE — Telephone Encounter (Signed)
Patient notified

## 2016-02-17 ENCOUNTER — Encounter: Payer: Self-pay | Admitting: Family Medicine

## 2016-02-17 ENCOUNTER — Encounter: Payer: Self-pay | Admitting: Radiology

## 2016-02-17 ENCOUNTER — Ambulatory Visit (INDEPENDENT_AMBULATORY_CARE_PROVIDER_SITE_OTHER): Payer: Commercial Managed Care - HMO | Admitting: Family Medicine

## 2016-02-17 VITALS — BP 120/70 | HR 108 | Temp 98.0°F | Wt 123.5 lb

## 2016-02-17 DIAGNOSIS — F411 Generalized anxiety disorder: Secondary | ICD-10-CM | POA: Diagnosis not present

## 2016-02-17 DIAGNOSIS — L409 Psoriasis, unspecified: Secondary | ICD-10-CM

## 2016-02-17 DIAGNOSIS — Z79899 Other long term (current) drug therapy: Secondary | ICD-10-CM | POA: Diagnosis not present

## 2016-02-17 MED ORDER — CLONAZEPAM 0.5 MG PO TABS: 0.2500 mg | ORAL_TABLET | Freq: Every day | ORAL | 0 refills | Status: DC | PRN

## 2016-02-17 MED ORDER — PREDNISONE 20 MG PO TABS: ORAL_TABLET | ORAL | 0 refills | Status: DC

## 2016-02-17 MED ORDER — CITALOPRAM HYDROBROMIDE 10 MG PO TABS: 10.0000 mg | ORAL_TABLET | Freq: Every day | ORAL | 3 refills | Status: DC

## 2016-02-17 NOTE — Progress Notes (Signed)
BP 120/70 (BP Location: Left Arm, Patient Position: Sitting, Cuff Size: Normal)   Pulse (!) 108   Temp 98 F (36.7 C) (Oral)   Wt 123 lb 8 oz (56 kg)   SpO2 97%   BMI 18.78 kg/m    CC: 1 mo f/u visit Subjective:    Patient ID: Jason Rodgers, male    DOB: 1960/08/28, 55 y.o.   MRN: [id number]  HPI: Jason Rodgers is a 55 y.o. male presenting on 02/17/2016 for Anxiety (Since changing from clonazepam to Buspar, he has had a flare up of his psoriosis. )   See prior note for details. Last visit we finished prolonged taper off klonopin.  For anxiety, we started buspar 5mg  once daily - and increased to 5mg  bid. However on 5mg  bid dose he started noticing blurred vision. We decreased buspar back to 5mg  once daily. Endorses worsening malaise, arthralgias, worse anxiety.   Notes flare of psoriasis with worsening anxiety. Has not recently seen dermatologist.   Continues lyrica 150mg  bid and trazodone 100mg  QHS.   H/o cocaine positive UDS 03/2015.   Relevant past medical, surgical, family and social history reviewed and updated as indicated. Interim medical history since our last visit reviewed. Allergies and medications reviewed and updated. Current Outpatient Prescriptions on File Prior to Visit  Medication Sig  . busPIRone (BUSPAR) 5 MG tablet Take 1 tablet (5 mg total) by mouth 2 (two) times daily. Take one daily for first 4 days  . clobetasol cream (TEMOVATE) 0.05 % Apply 1 application topically 2 (two) times daily. Apply to AA  . pregabalin (LYRICA) 150 MG capsule Take 1 capsule (150 mg total) by mouth 2 (two) times daily.  . traZODone (DESYREL) 100 MG tablet Take 1 tablet (100 mg total) by mouth at bedtime.   No current facility-administered medications on file prior to visit.     Review of Systems Per HPI unless specifically indicated in ROS section     Objective:    BP 120/70 (BP Location: Left Arm, Patient Position: Sitting, Cuff Size: Normal)   Pulse (!) 108   Temp 98 F  (36.7 C) (Oral)   Wt 123 lb 8 oz (56 kg)   SpO2 97%   BMI 18.78 kg/m   Wt Readings from Last 3 Encounters:  02/17/16 123 lb 8 oz (56 kg)  01/30/16 125 lb (56.7 kg)  07/25/15 128 lb 8 oz (58.3 kg)    Physical Exam  Constitutional: He appears well-developed and well-nourished. No distress.  Neurological:  Tremor present  Skin: Skin is warm and dry. Rash noted.  Scaly erythematous patches on dorsal digits, extensor surface of elbows, 2 larger patches bilateral buttocks  Psychiatric: His mood appears anxious.  Nursing note and vitals reviewed.     Assessment & Plan:   Problem List Items Addressed This Visit    Generalized anxiety disorder - Primary    Deteriorated anxiety state despite buspar - not tolerating well (blurry vision).  Will stop this, start celexa 10mg  daily. Klonopin 0.25-0.5mg  PRN anxiety for limited period while celexa kicks in. Pt agrees with plan. In h/o cocaine + UDS, update UDS today.   RTC 1 mo f/u visit.      Psoriasis    Flare anticipate due to worsening stress/uncontrolled anxiety.  Treat with prednisone course, continue clobetasol cream.  If not improved - pt will notify me for referral to dermatologist.        Other Visit Diagnoses   None.  Follow up plan: Return in about 4 weeks (around 03/16/2016) for follow up visit.  Eustaquio BoydenJavier Harlow Carrizales, MD

## 2016-02-17 NOTE — Patient Instructions (Addendum)
UDS today.  Stop buspar.  Start celexa 10mg  daily. May use klonopin 1/2-1 tablet once daily as needed for anxiety.  Start prednisone taper for psoriasis. If persistent flare, let me know and I will refer you to skin doctor. Return in 1 month for follow up visit.

## 2016-02-17 NOTE — Progress Notes (Signed)
Pre visit review using our clinic review tool, if applicable. No additional management support is needed unless otherwise documented below in the visit note. 

## 2016-02-17 NOTE — Assessment & Plan Note (Signed)
Flare anticipate due to worsening stress/uncontrolled anxiety.  Treat with prednisone course, continue clobetasol cream.  If not improved - pt will notify me for referral to dermatologist.

## 2016-02-17 NOTE — Assessment & Plan Note (Addendum)
Deteriorated anxiety state despite buspar - not tolerating well (blurry vision).  Will stop this, start celexa 10mg  daily. Klonopin 0.25-0.5mg  PRN anxiety for limited period while celexa kicks in. Pt agrees with plan. In h/o cocaine + UDS, update UDS today.   RTC 1 mo f/u visit.

## 2016-03-02 ENCOUNTER — Ambulatory Visit: Payer: Commercial Managed Care - HMO | Admitting: Family Medicine

## 2016-03-10 ENCOUNTER — Other Ambulatory Visit: Payer: Self-pay | Admitting: Family Medicine

## 2016-03-20 ENCOUNTER — Ambulatory Visit (INDEPENDENT_AMBULATORY_CARE_PROVIDER_SITE_OTHER): Payer: Commercial Managed Care - HMO | Admitting: Family Medicine

## 2016-03-20 ENCOUNTER — Encounter: Payer: Self-pay | Admitting: Family Medicine

## 2016-03-20 VITALS — BP 122/82 | HR 60 | Temp 97.9°F | Wt 125.5 lb

## 2016-03-20 DIAGNOSIS — Z23 Encounter for immunization: Secondary | ICD-10-CM | POA: Diagnosis not present

## 2016-03-20 DIAGNOSIS — G894 Chronic pain syndrome: Secondary | ICD-10-CM | POA: Diagnosis not present

## 2016-03-20 DIAGNOSIS — R892 Abnormal level of other drugs, medicaments and biological substances in specimens from other organs, systems and tissues: Secondary | ICD-10-CM | POA: Diagnosis not present

## 2016-03-20 DIAGNOSIS — F411 Generalized anxiety disorder: Secondary | ICD-10-CM

## 2016-03-20 DIAGNOSIS — L409 Psoriasis, unspecified: Secondary | ICD-10-CM | POA: Diagnosis not present

## 2016-03-20 MED ORDER — CITALOPRAM HYDROBROMIDE 10 MG PO TABS: 10.0000 mg | ORAL_TABLET | Freq: Every day | ORAL | 3 refills | Status: DC

## 2016-03-20 MED ORDER — CLOBETASOL PROPIONATE 0.05 % EX OINT: 1.0000 "application " | TOPICAL_OINTMENT | Freq: Two times a day (BID) | CUTANEOUS | 0 refills | Status: AC

## 2016-03-20 MED ORDER — CLONAZEPAM 0.5 MG PO TABS: 0.2500 mg | ORAL_TABLET | Freq: Every day | ORAL | 0 refills | Status: DC | PRN

## 2016-03-20 NOTE — Patient Instructions (Addendum)
Flu shot today I'm glad you're doing better. Continue current medicines. I will refill klonopin for another month.  Return in 4-6 weeks for follow up. Schedule physical in 3 months

## 2016-03-20 NOTE — Assessment & Plan Note (Signed)
Rx clobetasol ointment today.

## 2016-03-20 NOTE — Assessment & Plan Note (Signed)
Slowly improving on celexa + trazodone. Continue current regimen. Ultimate goal is to come off benzo.  Continues klonopin 0.5mg  1/2-1 tab QD PRN anxiety. Discussed continued taper of benzo.  RTC 6 wks f/u visit, RTC 3 mo CPE.

## 2016-03-20 NOTE — Assessment & Plan Note (Signed)
Treatment is lyrica 150mg  BID - continue.

## 2016-03-20 NOTE — Progress Notes (Signed)
   BP 122/82   Pulse 60   Temp 97.9 F (36.6 C) (Oral)   Wt 125 lb 8 oz (56.9 kg)   BMI 19.08 kg/m    CC: 1 mo f/u visit Subjective:    Patient ID: Jason Rodgers, male    DOB: 09-07-1960, [age] y.o.   MRN: [id number]  HPI: Jason Rodgers is a 55 y.o. male presenting on 03/20/2016 for Follow-up   GAD in setting of .  Failed buspar. celexa 10mg  daily started last month. Also started on klonopin 0.25-0.5mg  PRN for temporary course while celexa takes effect. Had a better month.   Continues trazodone 100mg  nightly.   Clobetasol helped psoriasis, requests refill, ointment.   H/o cocaine positive UDS 03/2015. Rpt UDS appropriate last month.  Relevant past medical, surgical, family and social history reviewed and updated as indicated. Interim medical history since our last visit reviewed. Allergies and medications reviewed and updated. Current Outpatient Prescriptions on File Prior to Visit  Medication Sig  . pregabalin (LYRICA) 150 MG capsule Take 1 capsule (150 mg total) by mouth 2 (two) times daily.  . traZODone (DESYREL) 100 MG tablet Take 1 tablet (100 mg total) by mouth at bedtime.   No current facility-administered medications on file prior to visit.     Review of Systems Per HPI unless specifically indicated in ROS section     Objective:    BP 122/82   Pulse 60   Temp 97.9 F (36.6 C) (Oral)   Wt 125 lb 8 oz (56.9 kg)   BMI 19.08 kg/m   Wt Readings from Last 3 Encounters:  03/20/16 125 lb 8 oz (56.9 kg)  02/17/16 123 lb 8 oz (56 kg)  01/30/16 125 lb (56.7 kg)    Physical Exam  Constitutional: He appears well-developed and well-nourished. No distress.  HENT:  Mouth/Throat: Oropharynx is clear and moist. No oropharyngeal exudate.  Cardiovascular: Normal rate, regular rhythm, normal heart sounds and intact distal pulses.   No murmur heard. Pulmonary/Chest: Effort normal and breath sounds normal. No respiratory distress. He has no wheezes. He has no rales.    Musculoskeletal: He exhibits no edema.  Psychiatric: His mood appears anxious.  Nursing note and vitals reviewed.     Assessment & Plan:   Problem List Items Addressed This Visit    Abnormal drug screen   Chronic pain syndrome    Treatment is lyrica 150mg  BID - continue.       Generalized anxiety disorder - Primary    Slowly improving on celexa + trazodone. Continue current regimen. Ultimate goal is to come off benzo.  Continues klonopin 0.5mg  1/2-1 tab QD PRN anxiety. Discussed continued taper of benzo.  RTC 6 wks f/u visit, RTC 3 mo CPE.       Psoriasis    Rx clobetasol ointment today.       Other Visit Diagnoses   None.      Follow up plan: Return in about 6 weeks (around 05/01/2016) for follow up visit.  Eustaquio BoydenJavier Jennice Renegar, MD

## 2016-04-18 ENCOUNTER — Other Ambulatory Visit: Payer: Self-pay

## 2016-04-18 NOTE — Telephone Encounter (Signed)
Ok to refill? Last filled 03/20/16 #30 0RF 

## 2016-04-18 NOTE — Telephone Encounter (Signed)
Pt left v/m requesting refill Klonopin; pt out of med. Pt last seen and rx last refilled # 30 on 03/20/16.Please advise.

## 2016-04-19 MED ORDER — CLONAZEPAM 0.5 MG PO TABS: 0.2500 mg | ORAL_TABLET | Freq: Every day | ORAL | 0 refills | Status: DC | PRN

## 2016-04-19 NOTE — Telephone Encounter (Signed)
Rx called in as directed.   

## 2016-04-19 NOTE — Telephone Encounter (Signed)
plz phone in. 

## 2016-05-01 ENCOUNTER — Ambulatory Visit: Payer: Commercial Managed Care - HMO | Admitting: Family Medicine

## 2016-05-15 ENCOUNTER — Encounter: Payer: Self-pay | Admitting: Family Medicine

## 2016-05-15 ENCOUNTER — Ambulatory Visit (INDEPENDENT_AMBULATORY_CARE_PROVIDER_SITE_OTHER): Payer: Commercial Managed Care - HMO | Admitting: Family Medicine

## 2016-05-15 VITALS — BP 86/70 | HR 106 | Temp 97.7°F | Resp 18 | Ht 68.0 in | Wt 125.0 lb

## 2016-05-15 DIAGNOSIS — I9589 Other hypotension: Secondary | ICD-10-CM | POA: Diagnosis not present

## 2016-05-15 DIAGNOSIS — F411 Generalized anxiety disorder: Secondary | ICD-10-CM | POA: Diagnosis not present

## 2016-05-15 DIAGNOSIS — F1491 Cocaine use, unspecified, in remission: Secondary | ICD-10-CM | POA: Insufficient documentation

## 2016-05-15 DIAGNOSIS — H6121 Impacted cerumen, right ear: Secondary | ICD-10-CM

## 2016-05-15 DIAGNOSIS — F1410 Cocaine abuse, uncomplicated: Secondary | ICD-10-CM

## 2016-05-15 DIAGNOSIS — R0989 Other specified symptoms and signs involving the circulatory and respiratory systems: Secondary | ICD-10-CM

## 2016-05-15 DIAGNOSIS — F1490 Cocaine use, unspecified, uncomplicated: Secondary | ICD-10-CM

## 2016-05-15 DIAGNOSIS — F17200 Nicotine dependence, unspecified, uncomplicated: Secondary | ICD-10-CM

## 2016-05-15 DIAGNOSIS — Z87898 Personal history of other specified conditions: Secondary | ICD-10-CM | POA: Insufficient documentation

## 2016-05-15 DIAGNOSIS — I771 Stricture of artery: Secondary | ICD-10-CM | POA: Insufficient documentation

## 2016-05-15 DIAGNOSIS — L409 Psoriasis, unspecified: Secondary | ICD-10-CM

## 2016-05-15 LAB — BASIC METABOLIC PANEL
BUN: 15 mg/dL (ref 6–23)
CALCIUM: 9.9 mg/dL (ref 8.4–10.5)
CO2: 33 meq/L — AB (ref 19–32)
CREATININE: 0.91 mg/dL (ref 0.40–1.50)
Chloride: 101 mEq/L (ref 96–112)
GFR: 91.75 mL/min (ref >60.00)
Glucose, Bld: 77 mg/dL (ref 70–99)
Potassium: 4.3 mEq/L (ref 3.5–5.1)
Sodium: 140 mEq/L (ref 135–145)

## 2016-05-15 LAB — TSH: TSH: 1.11 u[IU]/mL (ref 0.35–4.50)

## 2016-05-15 MED ORDER — CITALOPRAM HYDROBROMIDE 20 MG PO TABS: 20.0000 mg | ORAL_TABLET | Freq: Every day | ORAL | 6 refills | Status: DC

## 2016-05-15 MED ORDER — CLONAZEPAM 0.5 MG PO TABS: 0.2500 mg | ORAL_TABLET | Freq: Every day | ORAL | 0 refills | Status: DC

## 2016-05-15 NOTE — Patient Instructions (Addendum)
Let me know when you'd like to see dermatology. Return in 2 months for physical. Blood work today. 1/2 klonopin for next 2 weeks then stop.  Increase celexa 20mg  daily - take 2 until you run out.  Pass by [facility]'s office to schedule neck CT scan.

## 2016-05-15 NOTE — Progress Notes (Signed)
Pre visit review using our clinic review tool, if applicable. No additional management support is needed unless otherwise documented below in the visit note. 

## 2016-05-15 NOTE — Progress Notes (Signed)
BP (!) 86/70 (BP Location: Right Arm, Patient Position: Sitting, Cuff Size: Normal)   Pulse (!) 106   Temp 97.7 F (36.5 C) (Oral)   Resp 18   Ht 5\' 8"  (1.727 m)   Wt 125 lb (56.7 kg)   SpO2 96%   BMI 19.01 kg/m    CC: 36mo f/u visit Subjective:    Patient ID: CEDAR DITULLIO, male    DOB: Jul 03, 1960, 55 y.o.   MRN: [id number]  HPI: Jason Rodgers is a 55 y.o. male presenting on 05/15/2016 for Follow-up (medication refills. ) and Ear Fullness   GAD - failed buspar. Celexa 10mg  daily started last month. Also started on klonopin 0.25-0.5mg  PRN for temporary course while celexa takes effect. Continues trazodone 100mg  nightly.   R ear stopped up. No pain.   H/o cocaine positive UDS 03/2015. Rpt UDS appropriate 02/2016.   Continues smoking >1/2 ppd.  Alcohol - none.  rec drugs - endorses 2 lines of cocaine a few weeks ago at friend's birthday party.  Relevant past medical, surgical, family and social history reviewed and updated as indicated. Interim medical history since our last visit reviewed. Allergies and medications reviewed and updated. Current Outpatient Prescriptions on File Prior to Visit  Medication Sig  . clobetasol ointment (TEMOVATE) 0.05 % Apply 1 application topically 2 (two) times daily.  . pregabalin (LYRICA) 150 MG capsule Take 1 capsule (150 mg total) by mouth 2 (two) times daily.  . traZODone (DESYREL) 100 MG tablet Take 1 tablet (100 mg total) by mouth at bedtime.   No current facility-administered medications on file prior to visit.     Review of Systems Per HPI unless specifically indicated in ROS section     Objective:    BP (!) 86/70 (BP Location: Right Arm, Patient Position: Sitting, Cuff Size: Normal)   Pulse (!) 106   Temp 97.7 F (36.5 C) (Oral)   Resp 18   Ht 5\' 8"  (1.727 m)   Wt 125 lb (56.7 kg)   SpO2 96%   BMI 19.01 kg/m   Wt Readings from Last 3 Encounters:  05/15/16 125 lb (56.7 kg)  03/20/16 125 lb 8 oz (56.9 kg)  02/17/16 123 lb 8  oz (56 kg)    Physical Exam  Constitutional: He appears well-developed and well-nourished. No distress.  HENT:  Left Ear: Hearing, tympanic membrane, external ear and ear canal normal.  Mouth/Throat: Oropharynx is clear and moist. No oropharyngeal exudate.  R TM covered by cerumen  Neck: Normal range of motion. Neck supple. Carotid bruit is present (harsh R bruit).  Cardiovascular: Normal rate, regular rhythm, normal heart sounds and intact distal pulses.   No murmur heard. Pulmonary/Chest: Effort normal and breath sounds normal. No respiratory distress. He has no wheezes. He has no rales.  Musculoskeletal:  Somewhat thready pulse - 1+ rad on right, diminished on left  Skin: Skin is warm and dry. No rash noted.  Psychiatric: His mood appears anxious.  Nursing note and vitals reviewed.  Results for orders placed or performed during the hospital encounter of 10/26/14  CBC with Differential/Platelet  Result Value Ref Range   WBC 8.0 4.0 - 10.5 K/uL   RBC 4.78 4.22 - 5.81 MIL/uL   Hemoglobin 14.3 13.0 - 17.0 g/dL   HCT 40.9 81.1 - 91.4 %   MCV 90.2 78.0 - 100.0 fL   MCH 29.9 26.0 - 34.0 pg   MCHC 33.2 30.0 - 36.0 g/dL   RDW 78.2 95.6 -  15.5 %   Platelets 294 150 - 400 K/uL   Neutrophils Relative % 55 43 - 77 %   Neutro Abs 4.5 1.7 - 7.7 K/uL   Lymphocytes Relative 32 12 - 46 %   Lymphs Abs 2.5 0.7 - 4.0 K/uL   Monocytes Relative 11 3 - 12 %   Monocytes Absolute 0.9 0.1 - 1.0 K/uL   Eosinophils Relative 1 0 - 5 %   Eosinophils Absolute 0.1 0.0 - 0.7 K/uL   Basophils Relative 1 0 - 1 %   Basophils Absolute 0.0 0.0 - 0.1 K/uL  Comprehensive metabolic panel  Result Value Ref Range   Sodium 140 135 - 145 mmol/L   Potassium 3.6 3.5 - 5.1 mmol/L   Chloride 103 101 - 111 mmol/L   CO2 33 (H) 22 - 32 mmol/L   Glucose, Bld 101 (H) 70 - 99 mg/dL   BUN 22 (H) 6 - 20 mg/dL   Creatinine, Ser 1.610.83 0.61 - 1.24 mg/dL   Calcium 9.4 8.9 - 09.610.3 mg/dL   Total Protein 7.6 6.5 - 8.1 g/dL    Albumin 4.1 3.5 - 5.0 g/dL   AST 16 15 - 41 U/L   ALT 10 (L) 17 - 63 U/L   Alkaline Phosphatase 62 38 - 126 U/L   Total Bilirubin 0.5 0.3 - 1.2 mg/dL   GFR calc non Af Amer >60 >60 mL/min   GFR calc Af Amer >60 >60 mL/min   Anion gap 4 (L) 5 - 15  Urine rapid drug screen (hosp performed)  Result Value Ref Range   Opiates NONE DETECTED NONE DETECTED   Cocaine NONE DETECTED NONE DETECTED   Benzodiazepines POSITIVE (A) NONE DETECTED   Amphetamines NONE DETECTED NONE DETECTED   Tetrahydrocannabinol NONE DETECTED NONE DETECTED   Barbiturates NONE DETECTED NONE DETECTED  Ethanol  Result Value Ref Range   Alcohol, Ethyl (B) <5 <5 mg/dL  Salicylate level  Result Value Ref Range   Salicylate Lvl <4.0 2.8 - 30.0 mg/dL  Acetaminophen level  Result Value Ref Range   Acetaminophen (Tylenol), Serum <10 (L) 10 - 30 ug/mL      Assessment & Plan:   Problem List Items Addressed This Visit    Cocaine use    Pt endorses cocaine use a few weeks ago.  Advised full cessation.  Advised I would provide one final taper off klonopin then stop prescribing controlled subtance      Relevant Orders   CT ANGIO NECK W OR WO CONTRAST   Generalized anxiety disorder - Primary    Overall stable period on current regimen with klonopin 0.5mg  daily (had not cut in half). Will increase celexa to 20mg  daily, continue trazodone 100mg  nightly. Given recent cocaine use endorsed, advised I would stop klonopin - provided with #10 to last the next 2 wks at 1/2 tab daily PRN.       Relevant Orders   Basic metabolic panel   CBC with Differential/Platelet   TSH   Hearing loss of right ear due to cerumen impaction    Irrigation performed today.       Psoriasis    Cannot afford derm at this time.      Right carotid bruit    New harsh R carotid bruit in setting of hypotension with thready pulse R>L and recent cocaine use.  Otherwise denies of symptoms of chest pain, dyspnea, neck pain, dizziness, or headache.    Will need to check CTA neck to r/o carotid  or vertebral dissection.  Pt refused urgent CTA today, states he will return tomorrow to evaluate this. In interim, reviewed red flags to seek urgent ER care - pt agrees.  Update labs today - TSH, CBC, BMP      Relevant Orders   CT ANGIO NECK W OR WO CONTRAST   Smoker    Continue to encourage cessation.        Other Visit Diagnoses    Other specified hypotension       Relevant Orders   Basic metabolic panel   CBC with Differential/Platelet   TSH   CT ANGIO NECK W OR WO CONTRAST       Follow up plan: Return in about 2 months (around 07/15/2016) for annual exam, prior fasting for blood work.  Eustaquio BoydenJavier Matilde Pottenger, MD

## 2016-05-16 ENCOUNTER — Other Ambulatory Visit: Payer: Commercial Managed Care - HMO

## 2016-05-16 ENCOUNTER — Ambulatory Visit (INDEPENDENT_AMBULATORY_CARE_PROVIDER_SITE_OTHER)
Admission: RE | Admit: 2016-05-16 | Discharge: 2016-05-16 | Disposition: A | Discharge status: Home / Self Care | Payer: Commercial Managed Care - HMO | Source: Ambulatory Visit | Attending: Family Medicine | Admitting: Family Medicine

## 2016-05-16 ENCOUNTER — Encounter: Payer: Self-pay | Admitting: *Deleted

## 2016-05-16 DIAGNOSIS — F1410 Cocaine abuse, uncomplicated: Secondary | ICD-10-CM

## 2016-05-16 DIAGNOSIS — I9589 Other hypotension: Secondary | ICD-10-CM | POA: Diagnosis not present

## 2016-05-16 DIAGNOSIS — R0989 Other specified symptoms and signs involving the circulatory and respiratory systems: Secondary | ICD-10-CM

## 2016-05-16 DIAGNOSIS — I6502 Occlusion and stenosis of left vertebral artery: Secondary | ICD-10-CM | POA: Diagnosis not present

## 2016-05-16 DIAGNOSIS — I6522 Occlusion and stenosis of left carotid artery: Secondary | ICD-10-CM | POA: Diagnosis not present

## 2016-05-16 DIAGNOSIS — H6122 Impacted cerumen, left ear: Secondary | ICD-10-CM | POA: Insufficient documentation

## 2016-05-16 DIAGNOSIS — F1490 Cocaine use, unspecified, uncomplicated: Secondary | ICD-10-CM

## 2016-05-16 LAB — CBC WITH DIFFERENTIAL/PLATELET
BASOS ABS: 0.1 10*3/uL (ref 0.0–0.1)
Basophils Relative: 1.4 % (ref 0.0–3.0)
EOS ABS: 0.2 10*3/uL (ref 0.0–0.7)
Eosinophils Relative: 2.2 % (ref 0.0–5.0)
HEMATOCRIT: 42.7 % (ref 39.0–52.0)
Hemoglobin: 14.3 g/dL (ref 13.0–17.0)
LYMPHS PCT: 27.7 % (ref 12.0–46.0)
Lymphs Abs: 2.7 10*3/uL (ref 0.7–4.0)
MCHC: 33.4 g/dL (ref 30.0–36.0)
MCV: 88.9 fl (ref 78.0–100.0)
MONO ABS: 0.8 10*3/uL (ref 0.1–1.0)
Monocytes Relative: 8 % (ref 3.0–12.0)
NEUTROS ABS: 5.8 10*3/uL (ref 1.4–7.7)
Neutrophils Relative %: 60.7 % (ref 43.0–77.0)
PLATELETS: 279 10*3/uL (ref 150.0–400.0)
RBC: 4.8 Mil/uL (ref 4.22–5.81)
RDW: 14.8 % (ref 11.5–15.5)
WBC: 9.6 10*3/uL (ref 4.0–10.5)

## 2016-05-16 MED ORDER — IOPAMIDOL (ISOVUE-370) INJECTION 76%
80.0000 mL | Freq: Once | INTRAVENOUS | Status: AC | PRN
  Administered 2016-05-16: 80 mL via INTRAVENOUS

## 2016-05-16 NOTE — Assessment & Plan Note (Signed)
Cannot afford derm at this time.

## 2016-05-16 NOTE — Assessment & Plan Note (Addendum)
Pt endorses cocaine use a few weeks ago.  Advised full cessation.  Advised I would provide one final taper off klonopin then stop prescribing controlled subtance

## 2016-05-16 NOTE — Assessment & Plan Note (Signed)
Irrigation performed today. 

## 2016-05-16 NOTE — Assessment & Plan Note (Addendum)
New harsh R carotid bruit in setting of hypotension with thready pulse R>L and recent cocaine use.  Otherwise denies of symptoms of chest pain, dyspnea, neck pain, dizziness, or headache.  Will need to check CTA neck to r/o carotid or vertebral dissection.  Pt refused urgent CTA today, states he will return tomorrow to evaluate this. In interim, reviewed red flags to seek urgent ER care - pt agrees.  Update labs today - TSH, CBC, BMP

## 2016-05-16 NOTE — Assessment & Plan Note (Signed)
Continue to encourage cessation. 

## 2016-05-16 NOTE — Assessment & Plan Note (Signed)
Overall stable period on current regimen with klonopin 0.5mg  daily (had not cut in half). Will increase celexa to 20mg  daily, continue trazodone 100mg  nightly. Given recent cocaine use endorsed, advised I would stop klonopin - provided with #10 to last the next 2 wks at 1/2 tab daily PRN.

## 2016-05-18 ENCOUNTER — Encounter: Payer: Self-pay | Admitting: Family Medicine

## 2016-05-18 ENCOUNTER — Other Ambulatory Visit: Payer: Self-pay | Admitting: Family Medicine

## 2016-05-18 DIAGNOSIS — I771 Stricture of artery: Secondary | ICD-10-CM

## 2016-05-28 ENCOUNTER — Telehealth: Payer: Self-pay | Admitting: Family Medicine

## 2016-05-28 NOTE — Telephone Encounter (Signed)
TELEPHONE ADVICE RECORD [facility]  Patient Name: Jason KinRANDY Goldwire  DOB: 1960/10/26    Initial Comment Caller states his blood pressure was, it was his last reading 73/58. His knees feels cold, he has leg problems. He does not feel good, he has blurred vision. He does not feel right today.    Nurse Assessment  Nurse: Odis LusterBowers, RN, Bjorn Loserhonda Date/Time Jason Rodgers(Eastern Time): 05/28/2016 4:36:17 PM  Confirm and document reason for call. If symptomatic, describe symptoms. ---Caller states his blood pressure was, it was his last reading 73/58. His knees feels cold, he has leg problems. He does not feel good, he has blurred vision. He does not feel right today.  Does the patient have any new or worsening symptoms? ---Yes  Will a triage be completed? ---Yes  Related visit to physician within the last 2 weeks? ---N/A  Does the PT have any chronic conditions? (i.e. diabetes, asthma, etc.) ---Yes  List chronic conditions. ---takes Celexa and Lyrica for leg pain; arthritis;  Is this a behavioral health or substance abuse call? ---No     Guidelines    Guideline Title Affirmed Question Affirmed Notes  Low Blood Pressure [1] Systolic BP < 80 AND [2] NOT dizzy, lightheaded or weak    Final Disposition User   Go to ED Now Odis LusterBowers, RN, Bjorn Loserhonda    Referrals  [facility]   Disagree/Comply: Comply

## 2016-05-29 NOTE — Telephone Encounter (Signed)
Noted  

## 2016-05-29 NOTE — Telephone Encounter (Signed)
Unable to reach pt by phone to get update.

## 2016-05-29 NOTE — Telephone Encounter (Signed)
Spoke with patient. He said he was feeling MUCH better this AM and didn't feel the need to go to the ER. I asked that he recheck his BP this AM and call me back with the reading and to drink plenty of fluids in the meantime. He said he would. Will await return call.

## 2016-05-29 NOTE — Telephone Encounter (Signed)
Patient called me back and said his BP was 91/75 and he is feeling MUCH better. He said he will drink plenty of fluids and monitor his BP and call me back if it drops or if he feels bad again.

## 2016-05-29 NOTE — Telephone Encounter (Signed)
Agree with ER/911 with that BP.

## 2016-05-31 ENCOUNTER — Encounter: Payer: Commercial Managed Care - HMO | Admitting: Surgery

## 2016-06-08 ENCOUNTER — Encounter: Payer: Self-pay | Admitting: Vascular Surgery

## 2016-06-20 ENCOUNTER — Other Ambulatory Visit: Payer: Self-pay | Admitting: Family Medicine

## 2016-06-20 NOTE — Telephone Encounter (Signed)
Ok to refill? Last filled 05/15/16 #10 0RF

## 2016-06-21 NOTE — Telephone Encounter (Signed)
Denied. Now off klonopin. No further controlled substances from our office except for lyrica.

## 2016-06-22 ENCOUNTER — Encounter: Payer: Commercial Managed Care - HMO | Admitting: Vascular Surgery

## 2016-07-13 ENCOUNTER — Encounter: Payer: Commercial Managed Care - HMO | Admitting: Vascular Surgery

## 2016-07-16 ENCOUNTER — Encounter: Payer: Self-pay | Admitting: Vascular Surgery

## 2016-07-18 ENCOUNTER — Other Ambulatory Visit: Payer: Self-pay | Admitting: Family Medicine

## 2016-07-18 ENCOUNTER — Other Ambulatory Visit: Payer: Commercial Managed Care - HMO

## 2016-07-18 DIAGNOSIS — Z13220 Encounter for screening for lipoid disorders: Secondary | ICD-10-CM

## 2016-07-18 DIAGNOSIS — Z1159 Encounter for screening for other viral diseases: Secondary | ICD-10-CM

## 2016-07-18 DIAGNOSIS — Z114 Encounter for screening for human immunodeficiency virus [HIV]: Secondary | ICD-10-CM

## 2016-07-18 DIAGNOSIS — I771 Stricture of artery: Secondary | ICD-10-CM

## 2016-07-20 ENCOUNTER — Encounter: Payer: Commercial Managed Care - HMO | Admitting: Family Medicine

## 2016-07-20 ENCOUNTER — Encounter: Payer: Medicare HMO | Admitting: Vascular Surgery

## 2016-08-17 ENCOUNTER — Other Ambulatory Visit: Payer: Self-pay | Admitting: Family Medicine

## 2016-08-17 MED ORDER — PREGABALIN 150 MG PO CAPS: 150.0000 mg | ORAL_CAPSULE | Freq: Two times a day (BID) | ORAL | 5 refills | Status: DC

## 2016-08-17 NOTE — Telephone Encounter (Signed)
Bea with [facility] left v/m that Lyrica is controlled med and can not be refilled more than 6 months; [facility] Last seen 05/15/16.last refilled # 60 x 10 on 01/30/16.Please advise.

## 2016-08-17 NOTE — Addendum Note (Signed)
Addended by: Patience MuscaISLEY, RENA M on: 08/17/2016 04:03 PM   Modules accepted: Orders

## 2016-08-17 NOTE — Telephone Encounter (Signed)
plz phoen in. 

## 2016-08-20 NOTE — Telephone Encounter (Signed)
Rx called in as directed.   

## 2016-08-23 ENCOUNTER — Telehealth: Payer: Self-pay | Admitting: Family Medicine

## 2016-08-23 NOTE — Telephone Encounter (Signed)
Called pt to schedule AWV. No answer, just kept ringing

## 2016-10-04 ENCOUNTER — Encounter: Payer: Self-pay | Admitting: Family Medicine

## 2016-10-04 ENCOUNTER — Ambulatory Visit (INDEPENDENT_AMBULATORY_CARE_PROVIDER_SITE_OTHER): Payer: Medicare HMO | Admitting: Family Medicine

## 2016-10-04 VITALS — BP 108/88 | HR 81 | Temp 98.6°F | Wt 127.2 lb

## 2016-10-04 DIAGNOSIS — F1491 Cocaine use, unspecified, in remission: Secondary | ICD-10-CM

## 2016-10-04 DIAGNOSIS — I771 Stricture of artery: Secondary | ICD-10-CM | POA: Diagnosis not present

## 2016-10-04 DIAGNOSIS — Z87898 Personal history of other specified conditions: Secondary | ICD-10-CM

## 2016-10-04 DIAGNOSIS — F17200 Nicotine dependence, unspecified, uncomplicated: Secondary | ICD-10-CM | POA: Diagnosis not present

## 2016-10-04 DIAGNOSIS — G8929 Other chronic pain: Secondary | ICD-10-CM | POA: Diagnosis not present

## 2016-10-04 DIAGNOSIS — M25561 Pain in right knee: Secondary | ICD-10-CM

## 2016-10-04 DIAGNOSIS — M25562 Pain in left knee: Secondary | ICD-10-CM

## 2016-10-04 DIAGNOSIS — F411 Generalized anxiety disorder: Secondary | ICD-10-CM

## 2016-10-04 MED ORDER — CITALOPRAM HYDROBROMIDE 20 MG PO TABS: 20.0000 mg | ORAL_TABLET | Freq: Every day | ORAL | 11 refills | Status: DC

## 2016-10-04 MED ORDER — CELECOXIB 100 MG PO CAPS: 100.0000 mg | ORAL_CAPSULE | Freq: Two times a day (BID) | ORAL | 0 refills | Status: DC | PRN

## 2016-10-04 NOTE — Progress Notes (Signed)
Pre visit review using our clinic review tool, if applicable. No additional management support is needed unless otherwise documented below in the visit note. 

## 2016-10-04 NOTE — Assessment & Plan Note (Signed)
Overall stable period.  He requested klonopin Rx today, advised I recommended against this given prior h/o drug use.

## 2016-10-04 NOTE — Assessment & Plan Note (Signed)
Will trial celebrex  BID PRN. If unaffordable, consider other NSAID.

## 2016-10-04 NOTE — Progress Notes (Signed)
BP 108/88 (Cuff Size: Normal)   Pulse 81   Temp 98.6 F (37 C) (Oral)   Wt 127 lb 4 oz (57.7 kg)   SpO2 96%   BMI 19.35 kg/m    CC: discuss meds Subjective:    Patient ID: Jason Rodgers, male    DOB: Oct 31, 1960, 56 y.o.   MRN: [id number]  HPI: Jason Rodgers is a 56 y.o. male presenting on 10/04/2016 for Follow-up (discuss meds)   GAD failed buspar. On celexa  daily and trazodone  nightly - he reports taking med as perscribed. We completed taper off klonopin due to positive cocaine screen and continued use endorsed last visit 04/2016.   R carotid bruit - CTA 04/2016 showed predominantly fibro fatty plaque of left subclavian artery origin with severe 80% stenosis. It also showed mild centrilobular emphysema. He cancelled appt twice in January and no showed February appointment. Denies any recurrent cocaine use.  Off lyrica for the past 1-2 months - feeling better, burning leg and foot pain has improved. Would like to stay off this at this time.   Asks about celebrex for arthritis pains.   Relevant past medical, surgical, family and social history reviewed and updated as indicated. Interim medical history since our last visit reviewed. Allergies and medications reviewed and updated. Outpatient Medications Prior to Visit  Medication Sig Dispense Refill  . clobetasol ointment (TEMOVATE) 0.05 % Apply 1 application topically 2 (two) times daily. 30 g 0  . traZODone (DESYREL) 100 MG tablet Take 1 tablet (100 mg total) by mouth at bedtime. 30 tablet 11  . citalopram (CELEXA) 20 MG tablet Take 1 tablet (20 mg total) by mouth daily. 30 tablet 6  . pregabalin (LYRICA) 150 MG capsule Take 1 capsule (150 mg total) by mouth 2 (two) times daily. (Patient not taking: Reported on 10/04/2016) 60 capsule 5   No facility-administered medications prior to visit.      Per HPI unless specifically indicated in ROS section below Review of Systems     Objective:    BP 108/88 (Cuff Size: Normal)    Pulse 81   Temp 98.6 F (37 C) (Oral)   Wt 127 lb 4 oz (57.7 kg)   SpO2 96%   BMI 19.35 kg/m   Wt Readings from Last 3 Encounters:  10/04/16 127 lb 4 oz (57.7 kg)  05/15/16 125 lb (56.7 kg)  03/20/16 125 lb 8 oz (56.9 kg)    Physical Exam  Constitutional: He appears well-developed and well-nourished. No distress.  HENT:  Head: Normocephalic and atraumatic.  Mouth/Throat: Oropharynx is clear and moist. No oropharyngeal exudate.  Eyes: Conjunctivae and EOM are normal. Pupils are equal, round, and reactive to light. No scleral icterus.  Neck: Normal range of motion. Neck supple. Carotid bruit is present (R sided). No thyromegaly present.  Cardiovascular: Normal rate, regular rhythm, normal heart sounds and intact distal pulses.   No murmur heard. Pulmonary/Chest: Breath sounds normal. No respiratory distress. He has no wheezes. He has no rales.  coarse  Musculoskeletal: He exhibits no edema.  Lymphadenopathy:    He has no cervical adenopathy.  Skin: Skin is warm and dry. No rash noted.  Vitals reviewed.  Results for orders placed or performed in visit on 05/15/16  Basic metabolic panel  Result Value Ref Range   Sodium 140 135 - 145 mEq/L   Potassium 4.3 3.5 - 5.1 mEq/L   Chloride 101 96 - 112 mEq/L   CO2 33 (H) 19 -  32 mEq/L   Glucose, Bld 77 70 - 99 mg/dL   BUN 15 6 - 23 mg/dL   Creatinine, Ser 0.10 0.40 - 1.50 mg/dL   Calcium 9.9 8.4 - 27.2 mg/dL   GFR 53.66 >44.03 mL/min  CBC with Differential/Platelet  Result Value Ref Range   WBC 9.6 4.0 - 10.5 K/uL   RBC 4.80 4.22 - 5.81 Mil/uL   Hemoglobin 14.3 13.0 - 17.0 g/dL   HCT 47.4 25.9 - 56.3 %   MCV 88.9 78.0 - 100.0 fl   MCHC 33.4 30.0 - 36.0 g/dL   RDW 87.5 64.3 - 32.9 %   Platelets 279.0 150.0 - 400.0 K/uL   Neutrophils Relative % 60.7 43.0 - 77.0 %   Lymphocytes Relative 27.7 12.0 - 46.0 %   Monocytes Relative 8.0 3.0 - 12.0 %   Eosinophils Relative 2.2 0.0 - 5.0 %   Basophils Relative 1.4 0.0 - 3.0 %    Neutro Abs 5.8 1.4 - 7.7 K/uL   Lymphs Abs 2.7 0.7 - 4.0 K/uL   Monocytes Absolute 0.8 0.1 - 1.0 K/uL   Eosinophils Absolute 0.2 0.0 - 0.7 K/uL   Basophils Absolute 0.1 0.0 - 0.1 K/uL  TSH  Result Value Ref Range   TSH 1.11 0.35 - 4.50 uIU/mL      Assessment & Plan:   Problem List Items Addressed This Visit    Bilateral knee pain    Will trial celebrex  BID PRN. If unaffordable, consider other NSAID.       Generalized anxiety disorder    Overall stable period.  He requested klonopin Rx today, advised I recommended against this given prior h/o drug use.       History of cocaine use    h/o UDS positive for cocaine then endorsement of continued use 04/2016. Today he states he has not used this year. Discussed substance abuse counseling - he declines but is aware this is available for him if he decides to pursue.  Reviewed risks of cocaine use increasing vessel dissection risk as well as other cardiovascular concerns.       Smoker    Continue to encourage cessation. precontemplative.      Subclavian artery stenosis, left (HCC) - Primary    By CTA 04/2016. Interestingly bruit more apparent at right carotid. Patient cancelled [facility] appts 06/2016, then no showed appt 07/2016 - was scared to talk with surgeon. States today he is ready to proceed with evalutaion and requests referral. Aware if he no shows appointments he is less likely to be rescheduled.  Will return for FLP next week.  Encouraged to quit smoking.       Relevant Orders   Ambulatory referral to Vascular Surgery       Follow up plan: Return if symptoms worsen or fail to improve.  Eustaquio Boyden, MD

## 2016-10-04 NOTE — Assessment & Plan Note (Addendum)
By CTA 04/2016. Interestingly bruit more apparent at right carotid. Patient cancelled [facility]VS appts 06/2016, then no showed appt 07/2016 - was scared to talk with surgeon. States today he is ready to proceed with evalutaion and requests referral. Aware if he no shows appointments he is less likely to be rescheduled.  Will return for FLP next week.  Encouraged to quit smoking.

## 2016-10-04 NOTE — Assessment & Plan Note (Signed)
h/o UDS positive for cocaine then endorsement of continued use 04/2016. Today he states he has not used this year. Discussed substance abuse counseling - he declines but is aware this is available for him if he decides to pursue.  Reviewed risks of cocaine use increasing vessel dissection risk as well as other cardiovascular concerns.

## 2016-10-04 NOTE — Assessment & Plan Note (Signed)
Continue to encourage cessation. precontemplative. 

## 2016-10-04 NOTE — Patient Instructions (Addendum)
Return at your convenience for fasting labs to check cholesterol.  Work on quitting smoking.  Trial celebrex for joint pains - sent to pharmacy. If too expensive, let me know for different anti inflammatory medicine.  We will refer you back to vascular doctor.

## 2016-10-09 NOTE — Telephone Encounter (Signed)
Left pt message asking to call Allison back directly at [phone] to schedule AWV.+ labs with Lesia and CPE with PCP. °

## 2016-10-11 ENCOUNTER — Encounter: Payer: Self-pay | Admitting: Vascular Surgery

## 2016-10-24 ENCOUNTER — Ambulatory Visit (INDEPENDENT_AMBULATORY_CARE_PROVIDER_SITE_OTHER): Payer: Medicare HMO | Admitting: Vascular Surgery

## 2016-10-24 ENCOUNTER — Encounter: Payer: Self-pay | Admitting: Vascular Surgery

## 2016-10-24 VITALS — BP 99/83 | HR 70 | Temp 98.0°F | Resp 20 | Ht 68.0 in | Wt 132.5 lb

## 2016-10-24 DIAGNOSIS — I739 Peripheral vascular disease, unspecified: Secondary | ICD-10-CM | POA: Diagnosis not present

## 2016-10-24 DIAGNOSIS — R0989 Other specified symptoms and signs involving the circulatory and respiratory systems: Secondary | ICD-10-CM | POA: Diagnosis not present

## 2016-10-24 MED ORDER — ROSUVASTATIN CALCIUM 10 MG PO TABS: 10.0000 mg | ORAL_TABLET | Freq: Every day | ORAL | 3 refills | Status: DC

## 2016-10-24 NOTE — Progress Notes (Signed)
Patient ID: Jason Rodgers Drolet, male   DOB: 1960/11/16, 56 y.o.   MRN: [id number]  Reason for Consult: New Evaluation (eval L subclavian stenosis - ref by Dr. Sharen HonesGutierrez)   Referred by Eustaquio BoydenGutierrez, Javier, MD  Subjective:     HPI:  Jason Rodgers Bruns is a 56 y.o. male with history of smoking and drug abuse was evaluated with CT angiogram of his neck for hypotension with finding of right carotid bruit. He does not have significant carotid stenosis but was found to have high-grade left subclavian artery stenosis at that time. He has of only cancel 2 visits here out of fear of visiting. He does not take aspirin or statin drugs he does not take other blood thinners. He has been a longtime smoker and continues to do so. He has a family history of coronary artery disease but no personal history. He has never had stroke or TIA. He is right-hand dominant and does not have pain in his bilateral upper extremity. He is no pain with walking other than osteo-arthritis of his hips and knees. He does not have any specific complaints related to today's visit.  Past Medical History:  Diagnosis Date  . Abnormal drug screen 11/2014   inapprop neg tramadol rpt 3 mo (11/2014); inapprop positive oxycodone and cocaine - will taper off controlled substances (03/2015)  . Arthritis   . Drug abuse    cocaine, MJ  . GAD (generalized anxiety disorder)   . History of kidney stones 2000s  . Neuropathy   . Opiate abuse, episodic    per prior PCP records  . Psoriasis   . Smoker 2000   Family History  Problem Relation Age of Onset  . Arthritis Mother   . Hypertension Mother   . Arthritis Father   . CAD Father     MI  . Arthritis Paternal Grandmother   . Cancer Maternal Aunt     breast  . Diabetes Neg Hx   . Stroke Neg Hx    Past Surgical History:  Procedure Laterality Date  . cervical ESI  2017   [facility]  . KIDNEY STONE SURGERY  2006  . KNEE SURGERY Left 2010   meniscal tear [facility])  .  SH[facility]   [facility]    Short Social History:  Social History  Substance Use Topics  . Smoking status: Current Every Day Smoker    Packs/day: 0.50    Types: Cigarettes  . Smokeless tobacco: Never Used  . Alcohol use No    Allergies  Allergen Reactions  . Buspar [Buspirone] Other (See Comments)    Blurry vision  . Gabapentin Diarrhea and Nausea Only    Patient denies allergy to gabapentin  . Lyrica [Pregabalin] Rash    Patient denies allergy to Lyrica    Current Outpatient Prescriptions  Medication Sig Dispense Refill  . celecoxib (CELEBREX) 100 MG capsule Take 1 capsule (100 mg total) by mouth 2 (two) times daily as needed for moderate pain. 60 capsule 0  . citalopram (CELEXA) 20 MG tablet Take 1 tablet (20 mg total) by mouth daily. 30 tablet 11  . clobetasol ointment (TEMOVATE) 0.05 % Apply 1 application topically 2 (two) times daily. 30 g 0  . traZODone (DESYREL) 100 MG tablet Take 1 tablet (100 mg total) by mouth at bedtime. 30 tablet 11  . pregabalin (LYRICA) 150 MG capsule Take 1 capsule (150 mg total) by mouth 2 (two) times daily. (Patient  not taking: Reported on 10/04/2016) 60 capsule 5   No current facility-administered medications for this visit.     Review of Systems  Constitutional:  Constitutional negative. HENT: HENT negative.  Eyes: Eyes negative.  Respiratory: Respiratory negative.  Cardiovascular: Cardiovascular negative.  Musculoskeletal: Musculoskeletal negative.  Skin: Skin negative.  Neurological: Neurological negative. Hematologic: Hematologic/lymphatic negative.  Psychiatric: Psychiatric negative.        Objective:  Objective   Vitals:   10/24/16 1056 10/24/16 1057  BP: 119/81 99/83  Pulse: 70   Resp: 20   Temp: 98 F (36.7 C)   TempSrc: Oral   SpO2: 97%   Weight: 132 lb 8 oz (60.1 kg)   Height: 5\' 8"  (1.727 m)    Body mass index is 20.15 kg/m.  Physical Exam  Constitutional: He is  oriented to person, place, and time. He appears well-developed.  HENT:  Head: Normocephalic.  Neck: Normal range of motion.  Cardiovascular: Normal rate.   Pulses:      Radial pulses are 2+ on the right side.  Monophasic left radial signal  Pulmonary/Chest: Effort normal.  Abdominal: Soft. He exhibits no mass.  Musculoskeletal: Normal range of motion. He exhibits no edema.  Neurological: He is alert and oriented to person, place, and time. No cranial nerve deficit.  Skin: Skin is warm and dry.  Psoriasis of upper extremities  Psychiatric: He has a normal mood and affect. His behavior is normal. Judgment and thought content normal.    Data: IMPRESSION: 1. No evidence of dissection, hemodynamically significant stenosis, or occlusion of the carotid arteries. 2. Mild bilateral common carotid artery fibro fatty plaque, left carotid bifurcation fibro fatty plaque, and calcified plaque of right carotid bifurcation. 3. Severe 80% stenosis of left subclavian artery origin secondary to large fibro fatty plaque. 4. Long segment of moderate stenosis of left V1 and proximal V2 segments is also likely related to atherosclerosis, less likely chronic dissection. 5. Cervical spondylosis greatest from the C5 through C7 levels. No high-grade bony canal stenosis. 6. Mild centrilobular emphysema of the lung apices.      Assessment/Plan:     56 year old male here with finding of left subclavian high-grade stenoses on a CT angina performed for right carotid bruit. He does not have significant carotid stenosis is astigmatic from the left upper extremity standpoint. It is important that he urgently stop smoking. We will get him started on aspirin and I will send statin to his pharmacy today. He'll follow up in 1 year with carotid duplex. I discussed with him the signs and symptoms of stroke and TIA and he demonstrates good understanding and the presence of his father.     Maeola Harman  MD [facility]

## 2016-11-02 NOTE — Addendum Note (Signed)
Addended by: Burton ApleyPETTY, Janett Kamath A on: 11/02/2016 09:23 AM   Modules accepted: Orders

## 2017-01-04 ENCOUNTER — Other Ambulatory Visit: Payer: Self-pay | Admitting: Family Medicine

## 2017-01-11 ENCOUNTER — Ambulatory Visit (INDEPENDENT_AMBULATORY_CARE_PROVIDER_SITE_OTHER): Payer: Medicare HMO | Admitting: Family Medicine

## 2017-01-11 ENCOUNTER — Encounter: Payer: Self-pay | Admitting: Family Medicine

## 2017-01-11 VITALS — BP 102/82 | HR 103 | Temp 98.6°F | Wt 121.0 lb

## 2017-01-11 DIAGNOSIS — F17200 Nicotine dependence, unspecified, uncomplicated: Secondary | ICD-10-CM

## 2017-01-11 DIAGNOSIS — R5381 Other malaise: Secondary | ICD-10-CM

## 2017-01-11 DIAGNOSIS — I771 Stricture of artery: Secondary | ICD-10-CM

## 2017-01-11 DIAGNOSIS — R5383 Other fatigue: Secondary | ICD-10-CM | POA: Diagnosis not present

## 2017-01-11 DIAGNOSIS — G894 Chronic pain syndrome: Secondary | ICD-10-CM

## 2017-01-11 DIAGNOSIS — F411 Generalized anxiety disorder: Secondary | ICD-10-CM

## 2017-01-11 NOTE — Assessment & Plan Note (Signed)
Overall stable period on current regimen of celexa 20mg  daily and trazodone 100mg  nightly.

## 2017-01-11 NOTE — Progress Notes (Signed)
BP 102/82 (BP Location: Left Arm, Cuff Size: Normal)   Pulse (!) 103   Temp 98.6 F (37 C) (Oral)   Wt 121 lb (54.9 kg)   SpO2 96%   BMI 18.40 kg/m    CC: anxiety 10mo f/u Subjective:    Patient ID: Jason Rodgers Kinnamon, male    DOB: 09-Jul-1960, 56 y.o.   MRN: [id number]  HPI: Jason Rodgers Blackwell is a 56 y.o. male presenting on 01/11/2017 for Anxiety (Routine follow-up)   GAD failed buspar. On celexa 20mg  daily and trazodone 100mg  nightly - he reports taking med as perscribed. We completed taper off klonopin due to positive cocaine screen and continued use endorsed 04/2016.   Generalized malaise and fatigue. He did have tick bite 1 month ago left posterior heel. Denies fever, rash, new joint pains, headache, nausea.   Saw Dr Randie Heinzain [facility] 10/2016 for high grade 80% stenosis of L subclavian artery on CT scan - recommended urgently stop smoking, start aspirin, start statin. rec f/u 1 yr with carotid duplex.   Colon cancer screening - discussed. Pt will consider options.   Relevant past medical, surgical, family and social history reviewed and updated as indicated. Interim medical history since our last visit reviewed. Allergies and medications reviewed and updated. Outpatient Medications Prior to Visit  Medication Sig Dispense Refill  . celecoxib (CELEBREX) 100 MG capsule TAKE ONE CAPSULE BY MOUTH TWICE DAILY ASNEEDED FOR MODERATE PAIN 60 capsule 1  . citalopram (CELEXA) 20 MG tablet Take 1 tablet (20 mg total) by mouth daily. 30 tablet 11  . clobetasol ointment (TEMOVATE) 0.05 % Apply 1 application topically 2 (two) times daily. 30 g 0  . rosuvastatin (CRESTOR) 10 MG tablet Take 1 tablet (10 mg total) by mouth daily. 30 tablet 3  . traZODone (DESYREL) 100 MG tablet Take 1 tablet (100 mg total) by mouth at bedtime. 30 tablet 11  . pregabalin (LYRICA) 150 MG capsule Take 1 capsule (150 mg total) by mouth 2 (two) times daily. (Patient not taking: Reported on 10/04/2016) 60 capsule 5   No  facility-administered medications prior to visit.      Per HPI unless specifically indicated in ROS section below Review of Systems     Objective:    BP 102/82 (BP Location: Left Arm, Cuff Size: Normal)   Pulse (!) 103   Temp 98.6 F (37 C) (Oral)   Wt 121 lb (54.9 kg)   SpO2 96%   BMI 18.40 kg/m   Wt Readings from Last 3 Encounters:  01/11/17 121 lb (54.9 kg)  10/24/16 132 lb 8 oz (60.1 kg)  10/04/16 127 lb 4 oz (57.7 kg)    Physical Exam  Constitutional: He appears well-developed and well-nourished. No distress.  HENT:  Mouth/Throat: Oropharynx is clear and moist. No oropharyngeal exudate.  Cardiovascular: Normal rate, regular rhythm, normal heart sounds and intact distal pulses.   No murmur heard. Pulmonary/Chest: Effort normal and breath sounds normal. No respiratory distress. He has no wheezes. He has no rales.  Musculoskeletal: He exhibits no edema.  Skin: Rash noted.  Psychiatric: He has a normal mood and affect.  Nursing note and vitals reviewed.  Results for orders placed or performed in visit on 05/15/16  Basic metabolic panel  Result Value Ref Range   Sodium 140 135 - 145 mEq/L   Potassium 4.3 3.5 - 5.1 mEq/L   Chloride 101 96 - 112 mEq/L   CO2 33 (H) 19 - 32 mEq/L   Glucose, Bld  77 70 - 99 mg/dL   BUN 15 6 - 23 mg/dL   Creatinine, Ser 1.610.91 0.40 - 1.50 mg/dL   Calcium 9.9 8.4 - 09.610.5 mg/dL   GFR 04.5491.75 >09.81>60.00 mL/min  CBC with Differential/Platelet  Result Value Ref Range   WBC 9.6 4.0 - 10.5 K/uL   RBC 4.80 4.22 - 5.81 Mil/uL   Hemoglobin 14.3 13.0 - 17.0 g/dL   HCT 19.142.7 47.839.0 - 29.552.0 %   MCV 88.9 78.0 - 100.0 fl   MCHC 33.4 30.0 - 36.0 g/dL   RDW 62.114.8 30.811.5 - 65.715.5 %   Platelets 279.0 150.0 - 400.0 K/uL   Neutrophils Relative % 60.7 43.0 - 77.0 %   Lymphocytes Relative 27.7 12.0 - 46.0 %   Monocytes Relative 8.0 3.0 - 12.0 %   Eosinophils Relative 2.2 0.0 - 5.0 %   Basophils Relative 1.4 0.0 - 3.0 %   Neutro Abs 5.8 1.4 - 7.7 K/uL   Lymphs Abs 2.7  0.7 - 4.0 K/uL   Monocytes Absolute 0.8 0.1 - 1.0 K/uL   Eosinophils Absolute 0.2 0.0 - 0.7 K/uL   Basophils Absolute 0.1 0.0 - 0.1 K/uL  TSH  Result Value Ref Range   TSH 1.11 0.35 - 4.50 uIU/mL      Assessment & Plan:   Problem List Items Addressed This Visit    Chronic pain syndrome    Current regimen is celebrex 100mg  bid.       Generalized anxiety disorder - Primary    Overall stable period on current regimen of celexa 20mg  daily and trazodone 100mg  nightly.       Malaise and fatigue    Ongoing for last few weeks, worse after tick bite but doesn't endorse other tick borne illness symptoms so doubt related. Consider evaluating for reversible causes of fatigue at next labwork.       Smoker    Continue to encourage cessation.       Subclavian artery stenosis, left (HCC)    Appreciate [facility] care. Planned f/u next year.  Has been started on aspirin 81mg  daily and crestor 10mg  daily and reports compliance.       Relevant Medications   aspirin EC 81 MG tablet       Follow up plan: Return in about 2 months (around 03/14/2017) for annual exam, prior fasting for blood work.  Eustaquio BoydenJavier Jillian Warth, MD

## 2017-01-11 NOTE — Patient Instructions (Addendum)
Think about colon cancer screening - two options are colonoscopy or yearly stool kit. We will review at our physical. Return in 2 months for physical, prior fasting for blood work.

## 2017-01-11 NOTE — Assessment & Plan Note (Signed)
Current regimen is celebrex 100mg  bid.

## 2017-01-11 NOTE — Assessment & Plan Note (Signed)
Ongoing for last few weeks, worse after tick bite but doesn't endorse other tick borne illness symptoms so doubt related. Consider evaluating for reversible causes of fatigue at next labwork.

## 2017-01-11 NOTE — Assessment & Plan Note (Signed)
Continue to encourage cessation. 

## 2017-01-11 NOTE — Assessment & Plan Note (Addendum)
Appreciate [facility] care. Planned f/u next year.  Has been started on aspirin 81mg  daily and crestor 10mg  daily and reports compliance.

## 2017-02-13 ENCOUNTER — Other Ambulatory Visit: Payer: Self-pay | Admitting: Family Medicine

## 2017-03-12 ENCOUNTER — Other Ambulatory Visit: Payer: Medicare HMO

## 2017-03-12 ENCOUNTER — Other Ambulatory Visit (INDEPENDENT_AMBULATORY_CARE_PROVIDER_SITE_OTHER): Payer: Medicare HMO

## 2017-03-12 DIAGNOSIS — Z13220 Encounter for screening for lipoid disorders: Secondary | ICD-10-CM | POA: Diagnosis not present

## 2017-03-12 DIAGNOSIS — Z114 Encounter for screening for human immunodeficiency virus [HIV]: Secondary | ICD-10-CM

## 2017-03-12 DIAGNOSIS — I771 Stricture of artery: Secondary | ICD-10-CM

## 2017-03-12 DIAGNOSIS — Z1159 Encounter for screening for other viral diseases: Secondary | ICD-10-CM

## 2017-03-12 LAB — COMPREHENSIVE METABOLIC PANEL
ALBUMIN: 4.3 g/dL (ref 3.5–5.2)
ALT: 13 U/L (ref 0–53)
AST: 19 U/L (ref 0–37)
Alkaline Phosphatase: 56 U/L (ref 39–117)
BILIRUBIN TOTAL: 0.4 mg/dL (ref 0.2–1.2)
BUN: 10 mg/dL (ref 6–23)
CALCIUM: 9.7 mg/dL (ref 8.4–10.5)
CO2: 33 meq/L — AB (ref 19–32)
CREATININE: 0.95 mg/dL (ref 0.40–1.50)
Chloride: 101 mEq/L (ref 96–112)
GFR: 87.05 mL/min (ref >60.00)
Glucose, Bld: 93 mg/dL (ref 70–99)
Potassium: 4 mEq/L (ref 3.5–5.1)
Sodium: 138 mEq/L (ref 135–145)
Total Protein: 7.2 g/dL (ref 6.0–8.3)

## 2017-03-12 LAB — LIPID PANEL
CHOL/HDL RATIO: 4
CHOLESTEROL: 215 mg/dL — AB (ref 0–200)
HDL: 48.3 mg/dL (ref >39.00)
LDL Cholesterol: 154 mg/dL — ABNORMAL HIGH (ref 0–99)
NonHDL: 166.43
TRIGLYCERIDES: 62 mg/dL (ref 0.0–149.0)
VLDL: 12.4 mg/dL (ref 0.0–40.0)

## 2017-03-12 NOTE — Addendum Note (Signed)
Addended by: Alvina Chou on: 03/12/2017 12:18 PM   Modules accepted: Orders

## 2017-03-13 LAB — HEPATITIS C ANTIBODY
Hepatitis C Ab: NONREACTIVE
SIGNAL TO CUT-OFF: 0.01 (ref <1.00)

## 2017-03-13 LAB — HIV ANTIBODY (ROUTINE TESTING W REFLEX): HIV: NONREACTIVE

## 2017-03-15 ENCOUNTER — Ambulatory Visit (INDEPENDENT_AMBULATORY_CARE_PROVIDER_SITE_OTHER): Payer: Medicare HMO | Admitting: Family Medicine

## 2017-03-15 ENCOUNTER — Encounter: Payer: Self-pay | Admitting: Family Medicine

## 2017-03-15 VITALS — BP 112/62 | HR 110 | Temp 97.7°F | Ht 66.0 in | Wt 119.5 lb

## 2017-03-15 DIAGNOSIS — Z0000 Encounter for general adult medical examination without abnormal findings: Secondary | ICD-10-CM | POA: Diagnosis not present

## 2017-03-15 DIAGNOSIS — I771 Stricture of artery: Secondary | ICD-10-CM

## 2017-03-15 DIAGNOSIS — Z23 Encounter for immunization: Secondary | ICD-10-CM | POA: Diagnosis not present

## 2017-03-15 DIAGNOSIS — F17200 Nicotine dependence, unspecified, uncomplicated: Secondary | ICD-10-CM | POA: Diagnosis not present

## 2017-03-15 DIAGNOSIS — F411 Generalized anxiety disorder: Secondary | ICD-10-CM

## 2017-03-15 DIAGNOSIS — H6122 Impacted cerumen, left ear: Secondary | ICD-10-CM

## 2017-03-15 DIAGNOSIS — Z1211 Encounter for screening for malignant neoplasm of colon: Secondary | ICD-10-CM

## 2017-03-15 DIAGNOSIS — G894 Chronic pain syndrome: Secondary | ICD-10-CM

## 2017-03-15 NOTE — Assessment & Plan Note (Addendum)
Will be due for rpt carotids 04/2017 - consider odering next visit.  Compliant with aspirin and crestor daily.

## 2017-03-15 NOTE — Addendum Note (Signed)
Addended by: Nanci Pina on: 03/15/2017 03:11 PM   Modules accepted: Orders

## 2017-03-15 NOTE — Assessment & Plan Note (Signed)
Preventative protocols reviewed and updated unless pt declined. Discussed healthy diet and lifestyle.  

## 2017-03-15 NOTE — Patient Instructions (Addendum)
You are doing well today. We will refer you for colonoscopy.  Flu shot today. Congrats on 2nd grandchild! Return as needed or in 6 months for follow up visit.   Health Maintenance, Male A healthy lifestyle and preventive care is important for your health and wellness. Ask your health care provider about what schedule of regular examinations is right for you. What should I know about weight and diet? Eat a Healthy Diet  Eat plenty of vegetables, fruits, whole grains, low-fat dairy products, and lean protein.  Do not eat a lot of foods high in solid fats, added sugars, or salt.  Maintain a Healthy Weight Regular exercise can help you achieve or maintain a healthy weight. You should:  Do at least 150 minutes of exercise each week. The exercise should increase your heart rate and make you sweat (moderate-intensity exercise).  Do strength-training exercises at least twice a week.  Watch Your Levels of Cholesterol and Blood Lipids  Have your blood tested for lipids and cholesterol every 5 years starting at 56 years of age. If you are at high risk for heart disease, you should start having your blood tested when you are 56 years old. You may need to have your cholesterol levels checked more often if: ? Your lipid or cholesterol levels are high. ? You are older than 56 years of age. ? You are at high risk for heart disease.  What should I know about cancer screening? Many types of cancers can be detected early and may often be prevented. Lung Cancer  You should be screened every year for lung cancer if: ? You are a current smoker who has smoked for at least 30 years. ? You are a former smoker who has quit within the past 15 years.  Talk to your health care provider about your screening options, when you should start screening, and how often you should be screened.  Colorectal Cancer  Routine colorectal cancer screening usually begins at 56 years of age and should be repeated every  5-10 years until you are 56 years old. You may need to be screened more often if early forms of precancerous polyps or small growths are found. Your health care provider may recommend screening at an earlier age if you have risk factors for colon cancer.  Your health care provider may recommend using home test kits to check for hidden blood in the stool.  A small camera at the end of a tube can be used to examine your colon (sigmoidoscopy or colonoscopy). This checks for the earliest forms of colorectal cancer.  Prostate and Testicular Cancer  Depending on your age and overall health, your health care provider may do certain tests to screen for prostate and testicular cancer.  Talk to your health care provider about any symptoms or concerns you have about testicular or prostate cancer.  Skin Cancer  Check your skin from head to toe regularly.  Tell your health care provider about any new moles or changes in moles, especially if: ? There is a change in a mole's size, shape, or color. ? You have a mole that is larger than a pencil eraser.  Always use sunscreen. Apply sunscreen liberally and repeat throughout the day.  Protect yourself by wearing long sleeves, pants, a wide-brimmed hat, and sunglasses when outside.  What should I know about heart disease, diabetes, and high blood pressure?  If you are 34-43 years of age, have your blood pressure checked every 3-5 years. If you are  91 years of age or older, have your blood pressure checked every year. You should have your blood pressure measured twice-once when you are at a hospital or clinic, and once when you are not at a hospital or clinic. Record the average of the two measurements. To check your blood pressure when you are not at a hospital or clinic, you can use: ? An automated blood pressure machine at a pharmacy. ? A home blood pressure monitor.  Talk to your health care provider about your target blood pressure.  If you are  between 48-45 years old, ask your health care provider if you should take aspirin to prevent heart disease.  Have regular diabetes screenings by checking your fasting blood sugar level. ? If you are at a normal weight and have a low risk for diabetes, have this test once every three years after the age of 28. ? If you are overweight and have a high risk for diabetes, consider being tested at a younger age or more often.  A one-time screening for abdominal aortic aneurysm (AAA) by ultrasound is recommended for men aged 6-75 years who are current or former smokers. What should I know about preventing infection? Hepatitis B If you have a higher risk for hepatitis B, you should be screened for this virus. Talk with your health care provider to find out if you are at risk for hepatitis B infection. Hepatitis C Blood testing is recommended for:  Everyone born from [birth year] through 1965.  Anyone with known risk factors for hepatitis C.  Sexually Transmitted Diseases (STDs)  You should be screened each year for STDs including gonorrhea and chlamydia if: ? You are sexually active and are younger than 56 years of age. ? You are older than 56 years of age and your health care provider tells you that you are at risk for this type of infection. ? Your sexual activity has changed since you were last screened and you are at an increased risk for chlamydia or gonorrhea. Ask your health care provider if you are at risk.  Talk with your health care provider about whether you are at high risk of being infected with HIV. Your health care provider may recommend a prescription medicine to help prevent HIV infection.  What else can I do?  Schedule regular health, dental, and eye exams.  Stay current with your vaccines (immunizations).  Do not use any tobacco products, such as cigarettes, chewing tobacco, and e-cigarettes. If you need help quitting, ask your health care provider.  Limit alcohol intake to no  more than 2 drinks per day. One drink equals 12 ounces of beer, 5 ounces of wine, or 1 ounces of hard liquor.  Do not use street drugs.  Do not share needles.  Ask your health care provider for help if you need support or information about quitting drugs.  Tell your health care provider if you often feel depressed.  Tell your health care provider if you have ever been abused or do not feel safe at home. This information is not intended to replace advice given to you by your health care provider. Make sure you discuss any questions you have with your health care provider. Document Released: 12/01/2007 Document Revised: 02/01/2016 Document Reviewed: 03/08/2015 Elsevier Interactive Patient Education  [employer].

## 2017-03-15 NOTE — Progress Notes (Signed)
BP 112/62 (BP Location: Left Arm, Patient Position: Sitting, Cuff Size: Normal)   Pulse (!) 110   Temp 97.7 F (36.5 C) (Oral)   Ht  (1.676 m)   Wt 119 lb 8 oz (54.2 kg)   SpO2 97%   BMI 19.29 kg/m    Hearing Screening             Right ear:   Left ear:   0 40 40  0      Visual Acuity Screening   Right eye Left eye Both eyes  Without correction:  With correction:      CC: CPE Subjective:    Patient ID: Jason Rodgers, male    DOB: 11-Jun-1961, 56 y.o.   MRN: [id number]  HPI: Jason Rodgers is a 56 y.o. male presenting on 03/15/2017 for Medicare Wellness   Medicare for last 10 yrs  Preventative: Colon cancer screening - discussed, would like colonoscopy Prostate cancer screening - discussed, would like to defer for now.  Flu shot yearly (today) Tetanus ~2008. Declines Seat belt use discussed Sunscreen use discussed. No changing moles on skin. Smoking 1/2 ppd Alcohol - none  Lives alone, 1 dog Occ: truck driver, Financial planner at [employer], then disability - for L shoulder s/p surgery. 2006 Act: limited by joint pains, does try to walk regularly Diet: good water, poor fruits/vegetables  Relevant past medical, surgical, family and social history reviewed and updated as indicated. Interim medical history since our last visit reviewed. Allergies and medications reviewed and updated. Outpatient Medications Prior to Visit  Medication Sig Dispense Refill  . aspirin EC 81 MG tablet Take 81 mg by mouth daily.    . celecoxib (CELEBREX) 100 MG capsule TAKE ONE CAPSULE BY MOUTH TWICE DAILY ASNEEDED FOR MODERATE PAIN 60 capsule 1  . citalopram (CELEXA) 20 MG tablet Take 1 tablet (20 mg total) by mouth daily. 30 tablet 11  . clobetasol ointment (TEMOVATE) 0.05 % Apply 1 application topically 2 (two) times daily. 30 g 0  . rosuvastatin (CRESTOR) 10 MG tablet Take 1 tablet (10 mg total) by  mouth daily. 30 tablet 3  . traZODone (DESYREL) 100 MG tablet TAKE ONE TABLET BY MOUTH EVERY NIGHT AT BEDTIME 30 tablet 0   No facility-administered medications prior to visit.      Per HPI unless specifically indicated in ROS section below Review of Systems  Constitutional: Negative for activity change, appetite change, chills, fatigue, fever and unexpected weight change.  HENT: Negative for hearing loss.   Eyes: Negative for visual disturbance.  Respiratory: Negative for cough, chest tightness, shortness of breath and wheezing.   Cardiovascular: Negative for chest pain, palpitations and leg swelling.  Gastrointestinal: Negative for abdominal distention, abdominal pain, blood in stool, constipation, diarrhea, nausea and vomiting.  Genitourinary: Negative for difficulty urinating and hematuria.  Musculoskeletal: Negative for arthralgias, myalgias and neck pain.  Skin: Negative for rash.  Neurological: Negative for dizziness, seizures, syncope and headaches.  Hematological: Negative for adenopathy. Bruises/bleeds easily.  Psychiatric/Behavioral: Negative for dysphoric mood. The patient is not nervous/anxious.        Objective:    BP 112/62 (BP Location: Left Arm, Patient Position: Sitting, Cuff Size: Normal)   Pulse (!) 110   Temp 97.7 F (36.5 C) (Oral)   Ht  (1.676 m)   Wt 119 lb 8 oz (54.2 kg)   SpO2 97%   BMI 19.29  kg/m   Wt Readings from Last 3 Encounters:  03/15/17 119 lb 8 oz (54.2 kg)  01/11/17 121 lb (54.9 kg)  10/24/16 132 lb 8 oz (60.1 kg)    Physical Exam  Constitutional: He is oriented to person, place, and time. He appears well-developed and well-nourished. No distress.  HENT:  Head: Normocephalic and atraumatic.  Right Ear: Hearing, tympanic membrane, external ear and ear canal normal.  Left Ear: Hearing, tympanic membrane, external ear and ear canal normal.  Nose: Nose normal.  Mouth/Throat: Uvula is midline, oropharynx is clear and moist and mucous  membranes are normal. No oropharyngeal exudate, posterior oropharyngeal edema or posterior oropharyngeal erythema.  Eyes: Pupils are equal, round, and reactive to light. Conjunctivae and EOM are normal. No scleral icterus.  Neck: Normal range of motion. Neck supple. No thyromegaly present.  Cardiovascular: Normal rate, regular rhythm, normal heart sounds and intact distal pulses.   No murmur heard. Pulses:      Radial pulses are 2+ on the right side, and 2+ on the left side.  Pulmonary/Chest: Effort normal and breath sounds normal. No respiratory distress. He has no wheezes. He has no rales.  Abdominal: Soft. Bowel sounds are normal. He exhibits no distension and no mass. There is no tenderness. There is no rebound and no guarding.  Musculoskeletal: Normal range of motion. He exhibits no edema.  Lymphadenopathy:    He has no cervical adenopathy.  Neurological: He is alert and oriented to person, place, and time.  CN grossly intact, station and gait intact  Skin: Skin is warm and dry. No rash noted.  Psychiatric: He has a normal mood and affect. His behavior is normal. Judgment and thought content normal.  Nursing note and vitals reviewed.  Results for orders placed or performed in visit on 03/12/17  Lipid panel  Result Value Ref Range   Cholesterol 215 (H) 0 - 200 mg/dL   Triglycerides 96.0 0.0 - 149.0 mg/dL   HDL 45.40 >98.11 mg/dL   VLDL 91.4 0.0 - 78.2 mg/dL   LDL Cholesterol 956 (H) 0 - 99 mg/dL   Total CHOL/HDL Ratio 4    NonHDL 166.43   Comprehensive metabolic panel  Result Value Ref Range   Sodium 138 135 - 145 mEq/L   Potassium 4.0 3.5 - 5.1 mEq/L   Chloride 101 96 - 112 mEq/L   CO2 33 (H) 19 - 32 mEq/L   Glucose, Bld 93 70 - 99 mg/dL   BUN 10 6 - 23 mg/dL   Creatinine, Ser 2.13 0.40 - 1.50 mg/dL   Total Bilirubin 0.4 0.2 - 1.2 mg/dL   Alkaline Phosphatase 56 39 - 117 U/L   AST 19 0 - 37 U/L   ALT 13 0 - 53 U/L   Total Protein 7.2 6.0 - 8.3 g/dL   Albumin 4.3 3.5 -  5.2 g/dL   Calcium 9.7 8.4 - 08.6 mg/dL   GFR 57.84 >69.62 mL/min  Hepatitis C antibody  Result Value Ref Range   Hepatitis C Ab NON-REACTIVE NON-REACTI   SIGNAL TO CUT-OFF 0.01 <1.00  HIV antibody  Result Value Ref Range   HIV 1&2 Ab, 4th Generation NON-REACTIVE NON-REACTI      Assessment & Plan:   Problem List Items Addressed This Visit    Chronic pain syndrome    Continue celebrex  bid.       Generalized anxiety disorder    Stable on celexa  daily.  Avoid controlled substances in substance abuse history.  Health maintenance examination - Primary    Preventative protocols reviewed and updated unless pt declined. Discussed healthy diet and lifestyle.       Hearing loss due to cerumen impaction, left    Disimpaction with plastic curette performed today.       Smoker    Continue to encourage cessation. Contemplative.       Subclavian artery stenosis, left (HCC)    Will be due for rpt carotids 04/2017 - consider odering next visit.  Compliant with aspirin and crestor daily.        Other Visit Diagnoses    Special screening for malignant neoplasms, colon       Relevant Orders   Ambulatory referral to Gastroenterology       Follow up plan: Return in about 6 months (around 09/12/2017) for follow up visit.  Eustaquio Boyden, MD

## 2017-03-15 NOTE — Assessment & Plan Note (Signed)
Stable on celexa  daily.  Avoid controlled substances in substance abuse history.

## 2017-03-15 NOTE — Assessment & Plan Note (Signed)
Continue to encourage cessation. Contemplative. 

## 2017-03-15 NOTE — Assessment & Plan Note (Signed)
Disimpaction with plastic curette performed today.

## 2017-03-15 NOTE — Assessment & Plan Note (Signed)
Continue celebrex  bid.

## 2017-03-19 ENCOUNTER — Other Ambulatory Visit: Payer: Self-pay | Admitting: Family Medicine

## 2017-03-19 ENCOUNTER — Encounter: Payer: Self-pay | Admitting: Internal Medicine

## 2017-05-22 ENCOUNTER — Encounter: Payer: Medicare HMO | Admitting: Internal Medicine

## 2017-06-19 ENCOUNTER — Ambulatory Visit (AMBULATORY_SURGERY_CENTER): Payer: Self-pay

## 2017-06-19 ENCOUNTER — Other Ambulatory Visit: Payer: Self-pay

## 2017-06-19 VITALS — Ht 68.0 in | Wt 128.8 lb

## 2017-06-19 DIAGNOSIS — Z1211 Encounter for screening for malignant neoplasm of colon: Secondary | ICD-10-CM

## 2017-06-19 MED ORDER — NA SULFATE-K SULFATE-MG SULF 17.5-3.13-1.6 GM/177ML PO SOLN: 1.0000 | Freq: Once | ORAL | 0 refills | Status: AC

## 2017-06-19 NOTE — Progress Notes (Signed)
Denies allergies to eggs or soy products. Denies complication of anesthesia or sedation. Denies use of weight loss medication. Denies use of O2.   Emmi instructions declined.  

## 2017-06-27 ENCOUNTER — Encounter: Payer: Self-pay | Admitting: Internal Medicine

## 2017-07-01 ENCOUNTER — Telehealth: Payer: Self-pay | Admitting: Internal Medicine

## 2017-07-01 NOTE — Telephone Encounter (Signed)
Spoke with patient. He states he has been sick since last Tuesday, went to the Dr. And was dx with the flu, he is currently running a fever and is on an antibiotic. Patient's colonoscopy was rescheduled for 07/23/17 at 2:00 pm. New Suprep instructions mail to pt, he's aware.

## 2017-07-03 ENCOUNTER — Encounter: Payer: Medicare HMO | Admitting: Internal Medicine

## 2017-07-23 ENCOUNTER — Encounter: Payer: Medicare HMO | Admitting: Internal Medicine

## 2017-07-30 ENCOUNTER — Ambulatory Visit: Payer: Medicare HMO | Admitting: Family Medicine

## 2017-08-05 ENCOUNTER — Ambulatory Visit: Payer: Medicare HMO | Admitting: Family Medicine

## 2017-08-14 ENCOUNTER — Ambulatory Visit (INDEPENDENT_AMBULATORY_CARE_PROVIDER_SITE_OTHER): Payer: Medicare HMO | Admitting: Family Medicine

## 2017-08-14 ENCOUNTER — Encounter: Payer: Self-pay | Admitting: Family Medicine

## 2017-08-14 VITALS — BP 118/64 | HR 94 | Temp 97.9°F | Wt 127.0 lb

## 2017-08-14 DIAGNOSIS — F411 Generalized anxiety disorder: Secondary | ICD-10-CM | POA: Diagnosis not present

## 2017-08-14 DIAGNOSIS — G894 Chronic pain syndrome: Secondary | ICD-10-CM

## 2017-08-14 DIAGNOSIS — L409 Psoriasis, unspecified: Secondary | ICD-10-CM

## 2017-08-14 DIAGNOSIS — M79641 Pain in right hand: Secondary | ICD-10-CM

## 2017-08-14 DIAGNOSIS — I771 Stricture of artery: Secondary | ICD-10-CM | POA: Diagnosis not present

## 2017-08-14 DIAGNOSIS — R892 Abnormal level of other drugs, medicaments and biological substances in specimens from other organs, systems and tissues: Secondary | ICD-10-CM

## 2017-08-14 MED ORDER — ROSUVASTATIN CALCIUM 10 MG PO TABS: 10.0000 mg | ORAL_TABLET | Freq: Every day | ORAL | 11 refills | Status: AC

## 2017-08-14 MED ORDER — ROSUVASTATIN CALCIUM 10 MG PO TABS: 10.0000 mg | ORAL_TABLET | Freq: Every day | ORAL | 3 refills | Status: DC

## 2017-08-14 MED ORDER — TRAZODONE HCL 100 MG PO TABS: 100.0000 mg | ORAL_TABLET | Freq: Every day | ORAL | 11 refills | Status: DC

## 2017-08-14 MED ORDER — CITALOPRAM HYDROBROMIDE 20 MG PO TABS: 20.0000 mg | ORAL_TABLET | Freq: Every day | ORAL | 11 refills | Status: DC

## 2017-08-14 MED ORDER — TRAMADOL HCL 50 MG PO TABS: 25.0000 mg | ORAL_TABLET | Freq: Every day | ORAL | 0 refills | Status: DC | PRN

## 2017-08-14 NOTE — Patient Instructions (Addendum)
Medicines refilled today. Take crestor 10mg  daily if tolerated.  We will order repeat carotid ultrasound to monitor subclavian stenosis.  Take 1/2-1 tramadol per day for pain.  Try 1/2 dose of celexa and trazodone - because all 3 have similar ingredient.  Urine drug screen today.  Return in 1 month for follow up visit.

## 2017-08-14 NOTE — Assessment & Plan Note (Signed)
Reviewing records from rheum 2016 - labwork was not indicative of inflammatory arthritis per rheum, rec return to derm to discuss DMARD, but pt stated he was unable to afford f/u with derm. He also did not return for rheum f/u.

## 2017-08-14 NOTE — Assessment & Plan Note (Signed)
H/o this. Update UDS today.

## 2017-08-14 NOTE — Progress Notes (Signed)
BP 118/64 (BP Location: Left Arm, Patient Position: Sitting, Cuff Size: Normal)   Pulse 94   Temp 97.9 F (36.6 C) (Oral)   Wt 127 lb (57.6 kg)   SpO2 97%   BMI 19.31 kg/m    CC: 5 mo f/u visit Subjective:    Patient ID: Jason Rodgers, male    DOB: 11-04-1960, 57 y.o.   MRN: [id number]  HPI: Jason Rodgers is a 57 y.o. male presenting on 08/14/2017 for Follow-up   Chronic pain presumed osteoarthritis +/- psoriatic arthritis (see A/P section). States NSAIDs including celebrex did not help. We have avoided controlled substances with his substance abuse history. Endorses clean over the last 1+ years.   GAD - stable on celexa 20mg  daily and doing very well. Off klonopin.   HLD - not taking crestor 10mg  regularly  Worsening hand pain.   Relevant past medical, surgical, family and social history reviewed and updated as indicated. Interim medical history since our last visit reviewed. Allergies and medications reviewed and updated. Outpatient Medications Prior to Visit  Medication Sig Dispense Refill  . aspirin EC 81 MG tablet Take 81 mg by mouth daily.    . clobetasol ointment (TEMOVATE) 0.05 % Apply 1 application topically 2 (two) times daily. (Patient taking differently: Apply 1 application topically 2 (two) times daily. As needed) 30 g 0  . citalopram (CELEXA) 20 MG tablet Take 1 tablet (20 mg total) by mouth daily. 30 tablet 11  . rosuvastatin (CRESTOR) 10 MG tablet Take 1 tablet (10 mg total) by mouth daily. 30 tablet 3  . traZODone (DESYREL) 100 MG tablet TAKE ONE TABLET BY MOUTH EVERY NIGHT AT BEDTIME 30 tablet 5   No facility-administered medications prior to visit.      Per HPI unless specifically indicated in ROS section below Review of Systems     Objective:    BP 118/64 (BP Location: Left Arm, Patient Position: Sitting, Cuff Size: Normal)   Pulse 94   Temp 97.9 F (36.6 C) (Oral)   Wt 127 lb (57.6 kg)   SpO2 97%   BMI 19.31 kg/m   Wt Readings from Last 3  Encounters:  08/14/17 127 lb (57.6 kg)  06/19/17 128 lb 12.8 oz (58.4 kg)  03/15/17 119 lb 8 oz (54.2 kg)    Physical Exam  Constitutional: He appears well-developed and well-nourished. No distress.  Skin: Rash noted.  Scaling rash more noticeable R hand  Nursing note and vitals reviewed.  Results for orders placed or performed in visit on 03/12/17  Lipid panel  Result Value Ref Range   Cholesterol 215 (H) 0 - 200 mg/dL   Triglycerides 40.9 0.0 - 149.0 mg/dL   HDL 81.19 >14.78 mg/dL   VLDL 29.5 0.0 - 62.1 mg/dL   LDL Cholesterol 308 (H) 0 - 99 mg/dL   Total CHOL/HDL Ratio 4    NonHDL 166.43   Comprehensive metabolic panel  Result Value Ref Range   Sodium 138 135 - 145 mEq/L   Potassium 4.0 3.5 - 5.1 mEq/L   Chloride 101 96 - 112 mEq/L   CO2 33 (H) 19 - 32 mEq/L   Glucose, Bld 93 70 - 99 mg/dL   BUN 10 6 - 23 mg/dL   Creatinine, Ser 6.57 0.40 - 1.50 mg/dL   Total Bilirubin 0.4 0.2 - 1.2 mg/dL   Alkaline Phosphatase 56 39 - 117 U/L   AST 19 0 - 37 U/L   ALT 13 0 - 53 U/L  Total Protein 7.2 6.0 - 8.3 g/dL   Albumin 4.3 3.5 - 5.2 g/dL   Calcium 9.7 8.4 - 16.1 mg/dL   GFR 09.60 >45.40 mL/min  Hepatitis C antibody  Result Value Ref Range   Hepatitis C Ab NON-REACTIVE NON-REACTI   SIGNAL TO CUT-OFF 0.01 <1.00  HIV antibody  Result Value Ref Range   HIV 1&2 Ab, 4th Generation NON-REACTIVE NON-REACTI      Assessment & Plan:   Problem List Items Addressed This Visit    Abnormal drug screen    H/o this. Update UDS today.       Chronic pain syndrome - Primary    Pt states celebrex was not effective, nor other NSAID trials in the past. Endorses being clean for the last 1+ year. States tramadol has been very effective to help control chronic pain presumed from osteoarthritis. Will Rx 25-50mg  tramadol to take once daily #30 RF 0. Advised need to avoid any recreational drugs if we are to continue filling. Check UDS today.  I also did review serotonergic effect of combination  of medications he is on (trazodone, celexa, and tramadol) and encouraged he try 1/2 dose either celexa or trazodone to see if tolerated.  RTC 4-6 wks f/u visit.       Relevant Orders   Pain Mgmt, Profile 8 w/Conf, U   Generalized anxiety disorder    Chronic, stable on celexa 20mg  daily. He also takes trazodone 100mg  nightly for sleep. Suggested 1/2 dose of either or both meds if we're starting tramadol for pain management. See below.       Relevant Medications   traZODone (DESYREL) 100 MG tablet   citalopram (CELEXA) 20 MG tablet   Psoriasis    See above. Declined return to derm due to cost concerns      Right hand pain    Reviewing records from rheum 2016 - labwork was not indicative of inflammatory arthritis per rheum, rec return to derm to discuss DMARD, but pt stated he was unable to afford f/u with derm. He also did not return for rheum f/u.       Subclavian artery stenosis, left (HCC)    Discussed updating carotid US - however I see that he already has one scheduled for 10/2017 and f/u with [facility]. Will not order today.  Continue aspirin, crestor 10mg  encouraged daily use.       Relevant Medications   rosuvastatin (CRESTOR) 10 MG tablet       Meds ordered this encounter  Medications  . traZODone (DESYREL) 100 MG tablet    Sig: Take 1 tablet (100 mg total) by mouth at bedtime.    Dispense:  30 tablet    Refill:  11  . citalopram (CELEXA) 20 MG tablet    Sig: Take 1 tablet (20 mg total) by mouth daily.    Dispense:  30 tablet    Refill:  11  . DISCONTD: rosuvastatin (CRESTOR) 10 MG tablet    Sig: Take 1 tablet (10 mg total) by mouth daily.    Dispense:  30 tablet    Refill:  3  . rosuvastatin (CRESTOR) 10 MG tablet    Sig: Take 1 tablet (10 mg total) by mouth daily.    Dispense:  30 tablet    Refill:  11  . traMADol (ULTRAM) 50 MG tablet    Sig: Take 0.5-1 tablets (25-50 mg total) by mouth daily as needed for moderate pain.    Dispense:  30 tablet  Refill:  0    Orders Placed This Encounter  Procedures  . Pain Mgmt, Profile 8 w/Conf, U    Follow up plan: Return in about 6 weeks (around 09/25/2017), or if symptoms worsen or fail to improve, for follow up visit.  Eustaquio BoydenJavier Tamilyn Lupien, MD

## 2017-08-14 NOTE — Assessment & Plan Note (Signed)
See above. Declined return to derm due to cost concerns

## 2017-08-14 NOTE — Assessment & Plan Note (Signed)
Chronic, stable on celexa 20mg  daily. He also takes trazodone 100mg  nightly for sleep. Suggested 1/2 dose of either or both meds if we're starting tramadol for pain management. See below.

## 2017-08-14 NOTE — Assessment & Plan Note (Addendum)
Discussed updating carotid US - however I see that he already has one scheduled for 10/2017 and f/u with [facility]. Will not order today.  Continue aspirin, crestor 10mg  encouraged daily use.

## 2017-08-14 NOTE — Assessment & Plan Note (Signed)
Pt states celebrex was not effective, nor other NSAID trials in the past. Endorses being clean for the last 1+ year. States tramadol has been very effective to help control chronic pain presumed from osteoarthritis. Will Rx 25-50mg  tramadol to take once daily #30 RF 0. Advised need to avoid any recreational drugs if we are to continue filling. Check UDS today.  I also did review serotonergic effect of combination of medications he is on (trazodone, celexa, and tramadol) and encouraged he try 1/2 dose either celexa or trazodone to see if tolerated.  RTC 4-6 wks f/u visit.

## 2017-08-17 LAB — PAIN MGMT, PROFILE 8 W/CONF, U
6 Acetylmorphine: NEGATIVE ng/mL (ref <10)
ALCOHOL METABOLITES: NEGATIVE ng/mL (ref <500)
ALPHAHYDROXYTRIAZOLAM: NEGATIVE ng/mL (ref <50)
AMINOCLONAZEPAM: 77 ng/mL — AB (ref <25)
AMPHETAMINES: NEGATIVE ng/mL (ref <500)
Alphahydroxyalprazolam: NEGATIVE ng/mL (ref <25)
Alphahydroxymidazolam: NEGATIVE ng/mL (ref <50)
BUPRENORPHINE, URINE: NEGATIVE ng/mL (ref <5)
Benzodiazepines: POSITIVE ng/mL — AB (ref <100)
COCAINE METABOLITE: NEGATIVE ng/mL (ref <150)
CREATININE: 39.7 mg/dL
Hydroxyethylflurazepam: NEGATIVE ng/mL (ref <50)
LORAZEPAM: NEGATIVE ng/mL (ref <50)
MDMA: NEGATIVE ng/mL (ref <500)
Marijuana Metabolite: NEGATIVE ng/mL (ref <20)
Nordiazepam: NEGATIVE ng/mL (ref <50)
OXAZEPAM: NEGATIVE ng/mL (ref <50)
OXYCODONE: NEGATIVE ng/mL (ref <100)
Opiates: NEGATIVE ng/mL (ref <100)
Oxidant: NEGATIVE ug/mL (ref <200)
PH: 6.21 (ref 4.5–9.0)
TEMAZEPAM: NEGATIVE ng/mL (ref <50)

## 2017-08-29 IMAGING — CR DG SHOULDER 2+V*L*
1 series · 3 of 3 positions shown · non-contrast
Comparison: None.

CLINICAL DATA: 54-year-old male with left shoulder pain

EXAM:
LEFT SHOULDER - 2+ VIEW

[Series 1: w shoulder external left · 0.14mm/px · 3 of 3 slices shown]
[im 1/3]
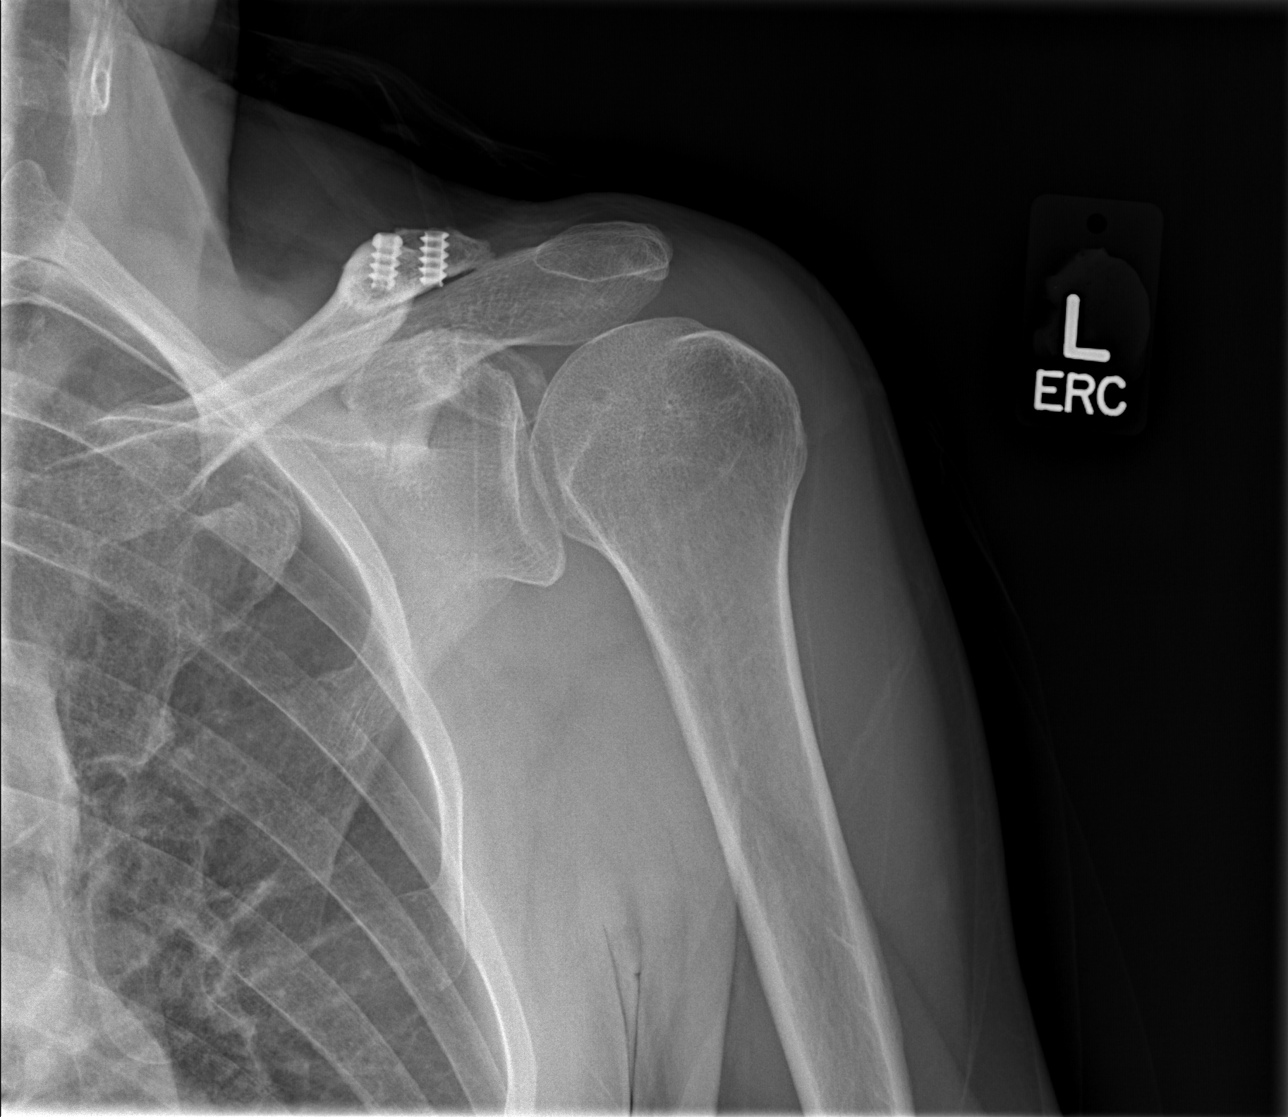
[im 2/3]
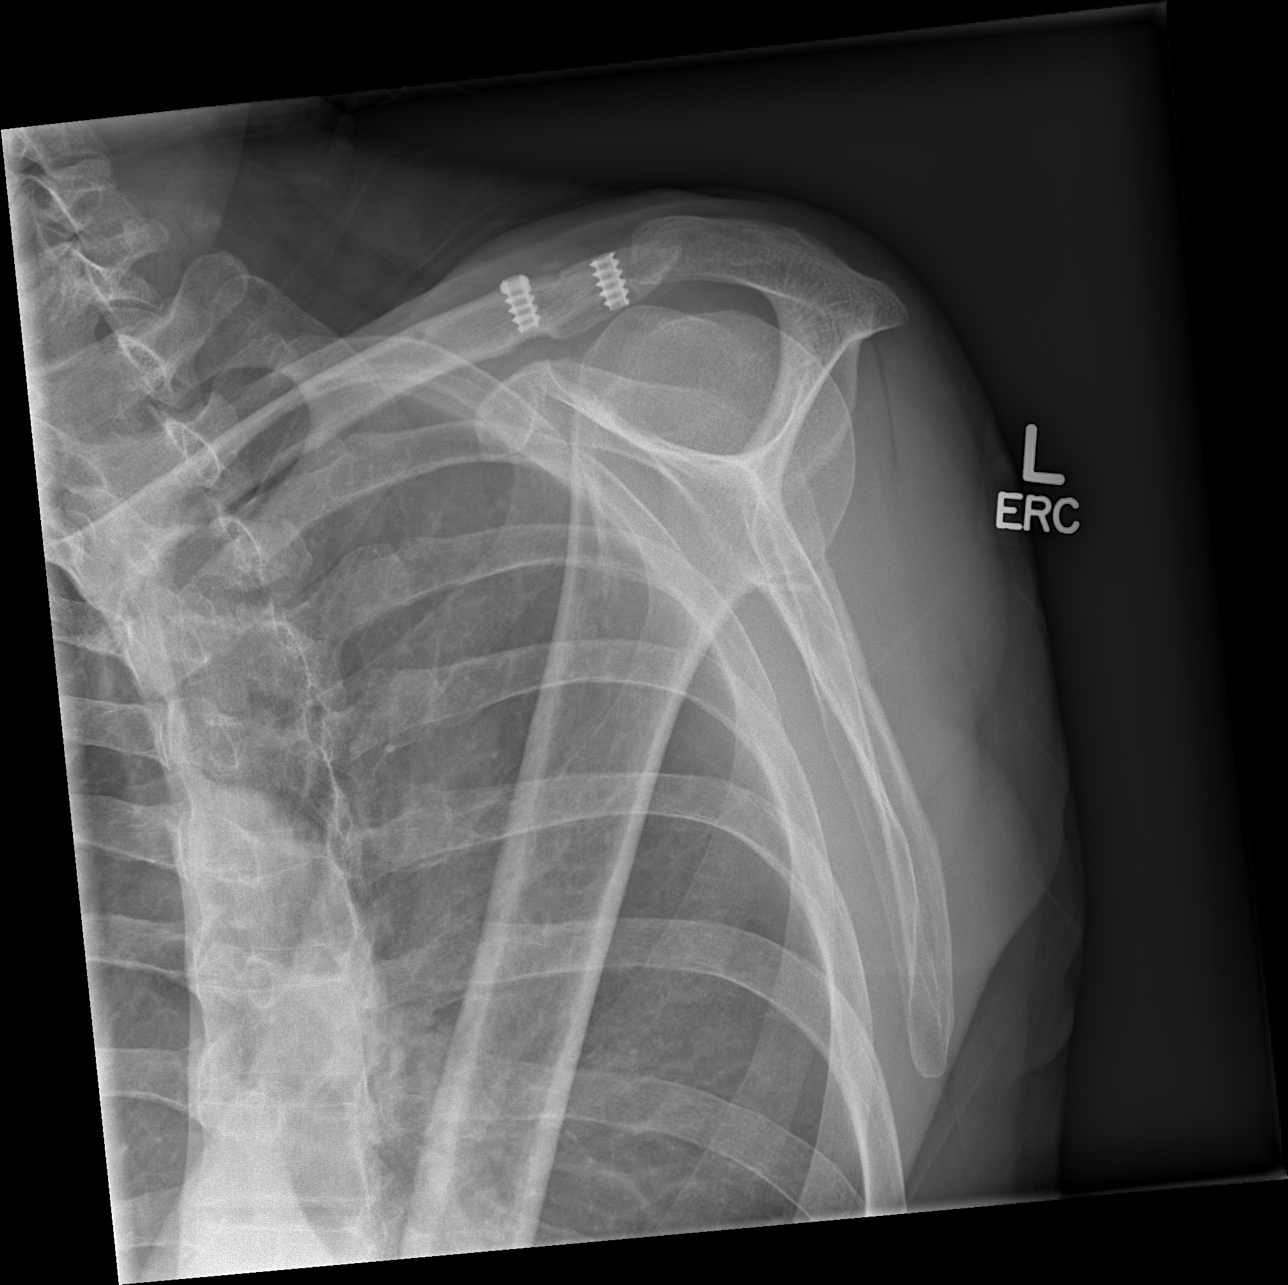
[im 3/3]
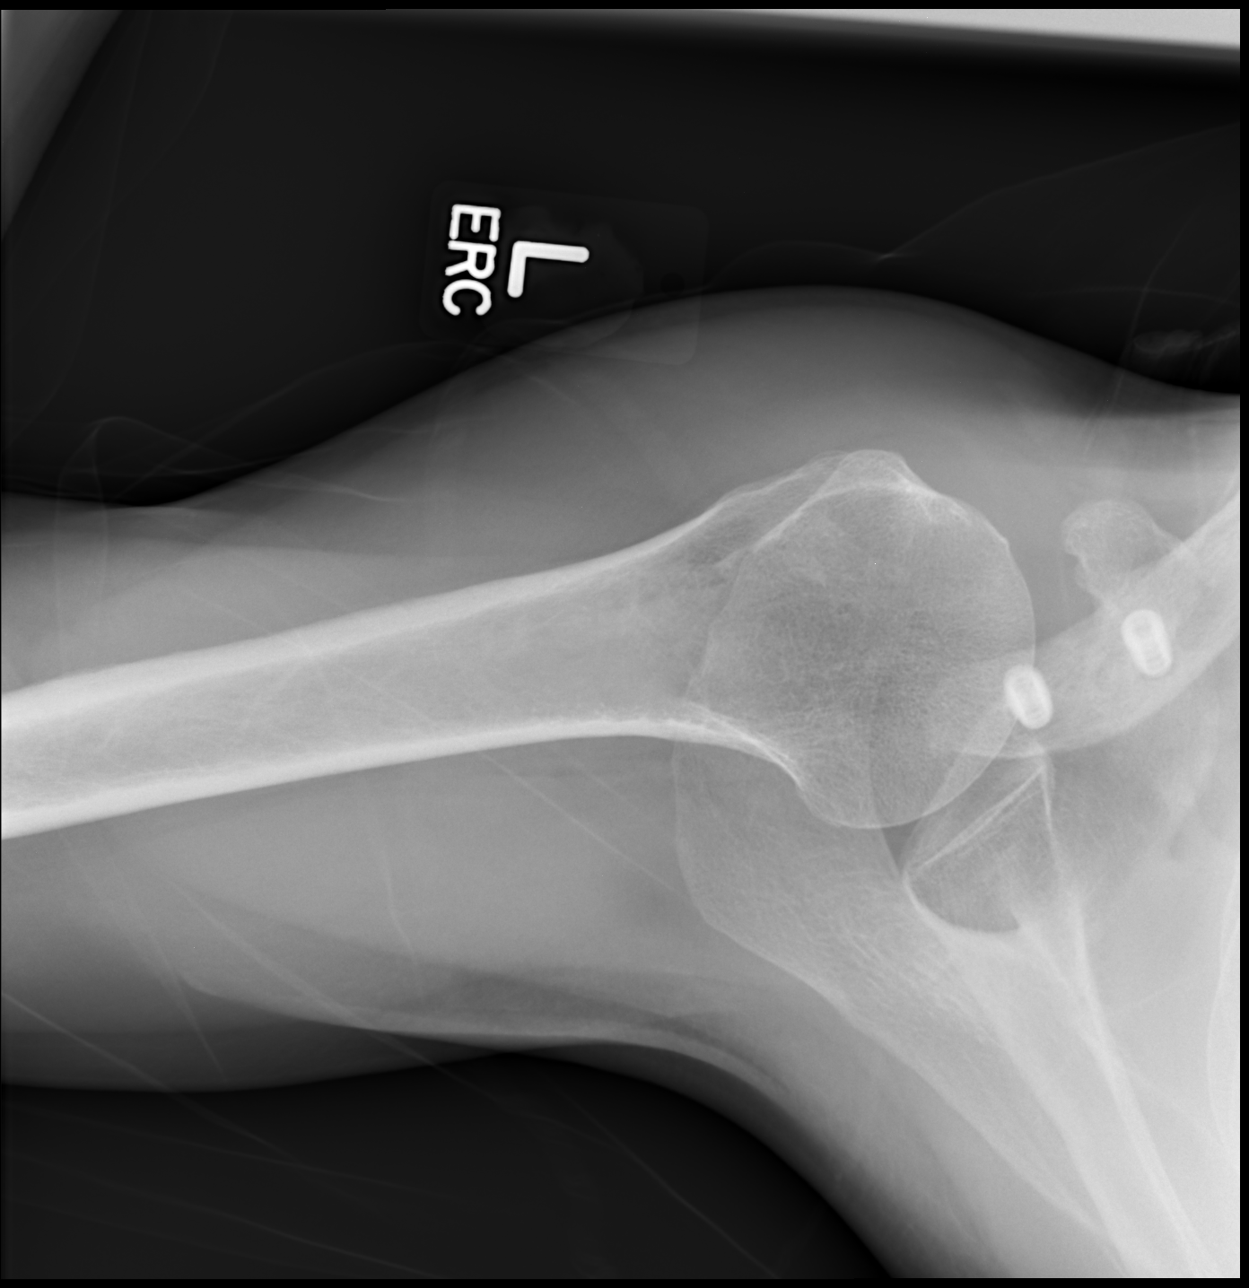

[3 of 3 positions shown; findings below may reference images not displayed]

FINDINGS: There is no acute fracture or dislocation. Old healed left
clavicular fracture with pinning noted. The soft tissues appear
unremarkable.
IMPRESSION: No acute fracture or dislocation.

## 2017-09-09 ENCOUNTER — Telehealth: Payer: Self-pay

## 2017-09-09 NOTE — Telephone Encounter (Signed)
Was unable to speak with pt or pts mother to ck on pts condition.

## 2017-09-09 NOTE — Telephone Encounter (Signed)
PLEASE NOTE: All timestamps contained within this report are represented as [place]. CONFIDENTIALTY NOTICE: This fax transmission is intended only for the addressee. It contains information that is legally privileged, confidential or otherwise protected from use or disclosure. If you are not the intended recipient, you are strictly prohibited from reviewing, disclosing, copying using or disseminating any of this information or taking any action in reliance on or regarding this information. If you have received this fax in error, please notify us immediately by telephone so that we can arrange for its return to us. Phone: [phone], Toll-Free: [phone], Fax: [phone] Page: 1 of 2 Call Id: [id number] [facility] - Client TELEPHONE ADVICE RECORD [facility] Patient Name: Jason KinRANDY Rodgers Gender: Male DOB: 11/27/1960 Age: [age] Y 10 M 9 D Return Phone Number: [phone] (Primary), [phone] (Secondary) Address: City[place]: [place] [id number] Client [facility] - Client Client Site [facility] Physician Eustaquio BoydenGutierrez, Javier - MD Contact Type Call Who Is Calling Patient / Member / Family / Caregiver Call Type Triage / Clinical Caller Name Antoine PocheRebecca Young Relationship To Patient Mother Return Phone Number [phone] (Primary) Chief Complaint Tooth pain Reason for Call Symptomatic / Request for Health Information Initial Comment Caller says that her son is having his teeth pulled to get teeth put in, and has an infection on a tooth and it is hurting. She has a few amocycillin she got last year. Translation No Nurse Assessment Nurse: Charlcie CradleAxmacher, RN, Alcario DroughtErica Date/Time (Eastern Time): 09/07/2017 5:14:21 PM Confirm and document reason for call. If symptomatic, describe symptoms. ---Caller states that son is having to have teeth pulled to get new teeth put it. This am he is  having signs of infection at one of the teeth (pain and swelling). Wanting to see about getting an rx called in. Also asking about giving him her old Amoxicillin rx. Does the patient have any new or worsening symptoms? ---Yes Will a triage be completed? ---Yes Related visit to physician within the last 2 weeks? ---No Does the PT have any chronic conditions? (i.e. diabetes, asthma, etc.) ---No Is this a behavioral health or substance abuse call? ---No Guidelines Guideline Title Affirmed Question Affirmed Notes Nurse Date/Time (Eastern Time) Toothache [1] SEVERE pain (e.g., excruciating, unable to do any normal activities) AND [2] not improved 2 hours after pain medicine Axmacher, RN, Alcario DroughtErica 09/07/2017 5:17:55 PM Disp. Time Lamount Cohen(Eastern Time) Disposition Final User 09/07/2017 5:09:31 PM Send To Nurse Filomena JunglingAssign Elliott, RN, Sue LushAndrea 09/07/2017 5:10:53 PM Send To Nurse Filomena JunglingAssign Elliott, RN, Gerre PebblesAndrea PLEASE NOTE: All timestamps contained within this report are represented as [place] Standard Time. CONFIDENTIALTY NOTICE: This fax transmission is intended only for the addressee. It contains information that is legally privileged, confidential or otherwise protected from use or disclosure. If you are not the intended recipient, you are strictly prohibited from reviewing, disclosing, copying using or disseminating any of this information or taking any action in reliance on or regarding this information. If you have received this fax in error, please notify us immediately by telephone so that we can arrange for its return to us. Phone: [phone], Toll-Free: [phone], Fax: [phone] Page: 2 of 2 Call Id: [id number] 09/07/2017 5:22:09 PM See Physician within 4 Hours (or PCP triage) Yes Axmacher, RN, Benjaman LobeErica Caller Disagree/Comply Disagree Caller Understands Yes PreDisposition Did not know what to do Care Advice Given Per Guideline SEE PHYSICIAN WITHIN 4 HOURS (or PCP triage): * IF OFFICE WILL  BE CLOSED  AND NO PCP TRIAGE: You need to be seen within the next 3 or 4 hours. A nearby Urgent Care Center is often a good source of care. Another choice is to go to the ER. Go sooner if you become worse. PAIN MEDICINES: * For pain relief, take acetaminophen, ibuprofen, or naproxen. LOCAL COLD: Apply a cold pack or ice in a wet washcloth to the painful area of the face for 20 minutes. CALL BACK IF: * You become worse. CARE ADVICE given per Toothache (Adult) guideline. Referrals GO TO FACILITY REFUSED

## 2017-09-09 NOTE — Telephone Encounter (Signed)
Patient has an appointment on Thursday, 3/28 at 1215 with Dr. Reece AgarG for 6 month f/u.  Per mother, patient was experiencing swollen, painful gums after trying to force a tooth to come out over the weekend.  She had him rinse mouth with peroxide and "stop messing with the tooth" and he is feeling much better.  He does not want to see a dentist at this point because it would be too expensive to have them actually pull the tooth itself.  I warned patient's mother of dangers of developing an infection/abcess and that if pain/swelling returns, he needs to see a dentist to manage.  Mother agrees and will let patient know.  In the meantime, he is doing well and will wait to see Dr. Sharen HonesGutierrez on Thursday for routine care.

## 2017-09-11 ENCOUNTER — Other Ambulatory Visit: Payer: Self-pay | Admitting: Family Medicine

## 2017-09-11 NOTE — Telephone Encounter (Signed)
Will route to office for provider review. 

## 2017-09-11 NOTE — Telephone Encounter (Signed)
Copied from CRM [id number]. Topic: Quick Communication - See Telephone Encounter >> Sep 11, 2017  2:18 PM Cipriano BunkerLambe, Annette S wrote: CRM for notification.  Pt. Has appt on Monday 4/1 and he is saying Tramdole did work and is asking if can call in 7 pills (what ins. Pay) to get until appt.   He had to reschedule as new grand son was born.   Took last pill today   See Telephone encounter for: 09/11/17.

## 2017-09-11 NOTE — Telephone Encounter (Signed)
Please see CRM below regarding refill.

## 2017-09-12 ENCOUNTER — Ambulatory Visit: Payer: Medicare HMO | Admitting: Family Medicine

## 2017-09-12 ENCOUNTER — Other Ambulatory Visit: Payer: Self-pay | Admitting: Family Medicine

## 2017-09-12 NOTE — Telephone Encounter (Signed)
Patient has called back to check on the status of this.  R/x has been "pended" for Dr. Timoteo ExposeG's approval.

## 2017-09-12 NOTE — Telephone Encounter (Signed)
Pt calling back about Tramadol 5-7 pills  he has an appt on Monday

## 2017-09-12 NOTE — Telephone Encounter (Signed)
Please see refill message string.  There is a pended refill request to Dr.G waiting for approval.

## 2017-09-13 ENCOUNTER — Ambulatory Visit: Payer: Medicare HMO | Admitting: Family Medicine

## 2017-09-13 MED ORDER — TRAMADOL HCL 50 MG PO TABS: 25.0000 mg | ORAL_TABLET | Freq: Every day | ORAL | 0 refills | Status: DC | PRN

## 2017-09-13 NOTE — Telephone Encounter (Signed)
E prescribed 7 tablet.s

## 2017-09-13 NOTE — Telephone Encounter (Signed)
Duplicate request

## 2017-09-16 ENCOUNTER — Ambulatory Visit: Payer: Medicare HMO | Admitting: Family Medicine

## 2017-09-16 DIAGNOSIS — Z0289 Encounter for other administrative examinations: Secondary | ICD-10-CM

## 2017-10-25 ENCOUNTER — Encounter (HOSPITAL_COMMUNITY): Payer: Medicare HMO

## 2017-10-25 ENCOUNTER — Ambulatory Visit: Payer: Medicare HMO | Admitting: Family

## 2018-01-05 ENCOUNTER — Other Ambulatory Visit: Payer: Self-pay | Admitting: Family Medicine

## 2018-01-07 NOTE — Telephone Encounter (Signed)
Pharmacy requests changing patient celexa r/x to a 90 day supply of medication.    Last OV 08/08/17 - he was to f/u in 1 month.  Since he has cancelled 1 appointment and no showed for 2  Next OV - none scheduled  Last Refill - #30, 11 refills on 08/14/17   As patient has medication but has not kept up f/u appointments, will deny changing to 90 day script until patient comes for f/u.  Notified pharmacy to inform patient.  Thanks.

## 2018-01-13 ENCOUNTER — Other Ambulatory Visit: Payer: Self-pay | Admitting: Family Medicine

## 2018-02-24 ENCOUNTER — Ambulatory Visit: Payer: Medicare HMO | Admitting: Family Medicine

## 2018-02-26 ENCOUNTER — Encounter: Payer: Self-pay | Admitting: Family Medicine

## 2018-02-26 ENCOUNTER — Ambulatory Visit (INDEPENDENT_AMBULATORY_CARE_PROVIDER_SITE_OTHER): Payer: Medicare HMO | Admitting: Family Medicine

## 2018-02-26 VITALS — BP 114/72 | HR 82 | Temp 98.3°F | Ht 66.0 in | Wt 125.5 lb

## 2018-02-26 DIAGNOSIS — G894 Chronic pain syndrome: Secondary | ICD-10-CM

## 2018-02-26 DIAGNOSIS — M24541 Contracture, right hand: Secondary | ICD-10-CM

## 2018-02-26 DIAGNOSIS — L409 Psoriasis, unspecified: Secondary | ICD-10-CM

## 2018-02-26 DIAGNOSIS — I771 Stricture of artery: Secondary | ICD-10-CM | POA: Diagnosis not present

## 2018-02-26 DIAGNOSIS — F411 Generalized anxiety disorder: Secondary | ICD-10-CM

## 2018-02-26 DIAGNOSIS — K089 Disorder of teeth and supporting structures, unspecified: Secondary | ICD-10-CM | POA: Diagnosis not present

## 2018-02-26 DIAGNOSIS — R892 Abnormal level of other drugs, medicaments and biological substances in specimens from other organs, systems and tissues: Secondary | ICD-10-CM

## 2018-02-26 DIAGNOSIS — Z23 Encounter for immunization: Secondary | ICD-10-CM

## 2018-02-26 MED ORDER — IBUPROFEN 800 MG PO TABS: 800.0000 mg | ORAL_TABLET | Freq: Two times a day (BID) | ORAL | 1 refills | Status: DC | PRN

## 2018-02-26 MED ORDER — AMOXICILLIN 500 MG PO CAPS: 500.0000 mg | ORAL_CAPSULE | Freq: Three times a day (TID) | ORAL | 0 refills | Status: AC

## 2018-02-26 MED ORDER — TRAZODONE HCL 100 MG PO TABS: ORAL_TABLET | ORAL | 3 refills | Status: DC

## 2018-02-26 NOTE — Patient Instructions (Addendum)
Flu shot today Call one of the clinics below to get dental work done. Take antibiotic course for mouth.  May take ibuprofen 800mg  with meals, limit to 2 a day, watch for GI upset. We will refer you to hand surgeon.  Return for physical at your convenience.   OPTIONS FOR DENTAL FOLLOW UP CARE  [facility] - Local Safety Net Dental Clinics [email or web address]  [facility] [address] in [place] [phone]) Discount dental care office with plans to help patient cover expenses. In addition, they also have walk - in emergency hours, patient can wait to be seen.   [facility] [phone]) This clinic caters to the indigent population and is on a lottery system. Location: [employer] [facility], [employer], [address] [place] [facility] Hours: Wednesdays from 6pm - 9pm, patients seen by a lottery system. For dates, call or go to [email or web address] Services: Cleanings, fillings and simple extractions. Payment Options: DENTAL WORK IS FREE OF CHARGE. Bring proof of income or support. Best way to get seen: Arrive at 5:15 pm - this is a lottery, NOT first come/first serve, so arriving earlier will not increase your chances of being seen.    [facility] [phone] Select option 1 for emergencies   Location: [facility], [place], [address] [place] [facility] Hours: No walk-ins accepted - call the day before to schedule an appointment. Check in times are 9:30 am and 1:30 pm. Services: Simple extractions, temporary fillings, pulpectomy/pulp debridement, uncomplicated abscess drainage. Payment Options: PAYMENT IS DUE AT THE TIME OF SERVICE.  Fee is usually $100-200, additional surgical procedures (e.g. abscess drainage) may be extra. Cash, checks, Visa/MasterCard accepted.  Can file Medicaid if patient is covered for  dental - patient should call case worker to check. No discount for [facility] patients. Best way to get seen: MUST call the day before and get onto the schedule. Can usually be seen the next 1-2 days. No walk-ins accepted.   [facility] [phone]   Location: [address] [facility] Hours: Walk-in Urgent Care Dental Services are offered Monday-Friday mornings only. The numbers of emergencies accepted daily is limited to the number of providers available. Maximum 15 - Mondays, Wednesdays & Thursdays Maximum 10 - Tuesdays & Fridays Services: You do not need to be a [facility] resident to be seen for a dental emergency. Emergencies are defined as pain, swelling, abnormal bleeding, or dental trauma. Walkins will receive x-rays if needed. NOTE: Dental cleaning is not an emergency. Payment Options: PAYMENT IS DUE AT THE TIME OF SERVICE. Minimum co-pay is $40.00 for uninsured patients. Minimum co-pay is $3.00 for Medicaid with dental coverage. Dental Insurance is accepted and must be presented at time of visit. Medicare does not cover dental. Forms of payment: Cash, credit card, checks. Best way to get seen: If not previously registered with the clinic, walk-in dental registration begins at 7:15 am and is on a first come/first serve basis. If previously registered with the clinic, call to make an appointment.

## 2018-02-26 NOTE — Assessment & Plan Note (Addendum)
Refer to hand surgery for evaluation. He states I told him to wait "until it worsened" I don't remember telling him this.

## 2018-02-26 NOTE — Progress Notes (Signed)
BP 114/72 (BP Location: Left Arm, Patient Position: Sitting, Cuff Size: Normal)   Pulse 82   Temp 98.3 F (36.8 C) (Oral)   Ht 5\' 6"  (1.676 m)   Wt 125 lb 8 oz (56.9 kg)   SpO2 98%   BMI 20.26 kg/m    CC: joint pains Subjective:    Patient ID: Jason Rodgers, male    DOB: 1960-09-06, 57 y.o.   MRN: [id number]  HPI: Jason Rodgers is a 57 y.o. male presenting on 02/26/2018 for Hand Pain (C/o right hand pain that is worsening. ); Knee Pain (Bilateral knee pain for yrs, worsening. ); and Foot Pain (Bilateral foot pain for yrs, worsening. )   Chronic poly-arthralgias for years, progressively worsening - R hand, bilateral knees and feet. Notes increasing stiffness and numbness of R hand. Has R hand contracture present.   Last saw rheumatology  Dr Dierdre Forth 03/2015 and started on meloxicam at that time. rec derm f/u to consider disease modifying anti-rheumatic medication. He did not see derm due to financial concerns.  Has seen ortho s/p L knee and shoulder surgeries.  He also has received cervical ESI 2017 (Dr Waymon Budge @ [facility])  Psoriasis is currently well controlled as he's been out in sun with granddaughter - going to pool.  Stays busy caring for grandchildren.   NSAIDs and celebrex ineffective in the past. Previously on lyrica for cervical radiculopathy.  Previously on tramadol - UDS abnormally positive oxycodone/cocaine 03/2015, abnormally positive klonopin 07/2017 so we have stopped prescribing controlled substances from our office.   Poor dentition - he has pulled teeth out on his own! R lower jaw pain.  Relevant past medical, surgical, family and social history reviewed and updated as indicated. Interim medical history since our last visit reviewed. Allergies and medications reviewed and updated. Outpatient Medications Prior to Visit  Medication Sig Dispense Refill  . aspirin EC 81 MG tablet Take 81 mg by mouth daily.    . clobetasol ointment (TEMOVATE) 0.05 % Apply 1  application topically 2 (two) times daily. (Patient taking differently: Apply 1 application topically 2 (two) times daily. As needed) 30 g 0  . rosuvastatin (CRESTOR) 10 MG tablet Take 1 tablet (10 mg total) by mouth daily. 30 tablet 11  . traZODone (DESYREL) 100 MG tablet TAKE 1 TABLET BY MOUTH EVERYDAY AT BEDTIME 90 tablet 0  . citalopram (CELEXA) 20 MG tablet Take 1 tablet (20 mg total) by mouth daily. 30 tablet 11  . traMADol (ULTRAM) 50 MG tablet Take 0.5-1 tablets (25-50 mg total) by mouth daily as needed for moderate pain. 7 tablet 0   No facility-administered medications prior to visit.      Per HPI unless specifically indicated in ROS section below Review of Systems     Objective:    BP 114/72 (BP Location: Left Arm, Patient Position: Sitting, Cuff Size: Normal)   Pulse 82   Temp 98.3 F (36.8 C) (Oral)   Ht 5\' 6"  (1.676 m)   Wt 125 lb 8 oz (56.9 kg)   SpO2 98%   BMI 20.26 kg/m   Wt Readings from Last 3 Encounters:  02/26/18 125 lb 8 oz (56.9 kg)  08/14/17 127 lb (57.6 kg)  06/19/17 128 lb 12.8 oz (58.4 kg)    Physical Exam  Constitutional: He appears well-developed and well-nourished. No distress.  HENT:  Head: Normocephalic and atraumatic.  Mouth/Throat: Oropharynx is clear and moist. Abnormal dentition. Dental caries present. No dental abscesses. No oropharyngeal  exudate.  Poor dentition, several missing teeth No obvious dental abscess  Musculoskeletal: He exhibits no edema.  No signs of active synovitis - no current erythema, edema, warmth R hand contracture at 4th palmar MT involving 4th/5th digits  Lymphadenopathy:    He has no cervical adenopathy.  Nursing note and vitals reviewed.  Lab Results  Component Value Date   CREATININE 0.95 03/12/2017   BUN 10 03/12/2017   NA 138 03/12/2017   K 4.0 03/12/2017   CL 101 03/12/2017   CO2 33 (H) 03/12/2017       Assessment & Plan:   Problem List Items Addressed This Visit    Subclavian artery stenosis,  left (HCC)    Saw [facility] Dr Randie Heinz 10/2016 - rec aspirin, statin, stop smoking, rec f/u 1 year and yearly carotid US.  Due for rpt carotid US - will order. He never completed one ordered by [facility]      Relevant Orders   [facility] US CAROTID   Psoriasis    Stable period due to regular sun exposure this summer.  Possible psoriatic arthritis, never followed up with dermatology due to financial concerns.  Will monitor for now.       Generalized anxiety disorder    Tapered himself off celexa, now only taking trazodone for sleep with good effect - refilled today.       Relevant Medications   traZODone (DESYREL) 100 MG tablet   Dental disease    Poor dentition throughout. He states he has pulled his own teeth. Advised against this. Will Rx amoxicillin course preventatively. Advised f/u with dentist - info provided for local free dental clinics.       Contracture of right hand - Primary    Refer to hand surgery for evaluation. He states I told him to wait "until it worsened" I don't remember telling him this.       Relevant Orders   Ambulatory referral to Hand Surgery   Chronic pain syndrome    High risk patient - avoid controlled substances Ibuprofen 800mg  tid refilled, reviewed monitoring for GI distress, monitoring kidney function.       Abnormal drug screen    Discussed latest UDS results inappropriately positive for klonopin       Other Visit Diagnoses    Need for influenza vaccination       Relevant Orders   Flu Vaccine QUAD 36+ mos IM (Completed)       Meds ordered this encounter  Medications  . traZODone (DESYREL) 100 MG tablet    Sig: TAKE 1 TABLET BY MOUTH EVERYDAY AT BEDTIME    Dispense:  90 tablet    Refill:  3  . ibuprofen (ADVIL,MOTRIN) 800 MG tablet    Sig: Take 1 tablet (800 mg total) by mouth 2 (two) times daily as needed for moderate pain.    Dispense:  60 tablet    Refill:  1  . amoxicillin (AMOXIL) 500 MG capsule    Sig: Take 1 capsule (500 mg total) by mouth 3  (three) times daily.    Dispense:  21 capsule    Refill:  0   Orders Placed This Encounter  Procedures  . Flu Vaccine QUAD 36+ mos IM  . Ambulatory referral to Hand Surgery    Referral Priority:   Routine    Referral Type:   Surgical    Referral Reason:   Specialty Services Required    Requested Specialty:   Hand Surgery    Number of  Visits Requested:   1    Follow up plan: Return if symptoms worsen or fail to improve.  Ria Bush, MD

## 2018-03-01 DIAGNOSIS — K089 Disorder of teeth and supporting structures, unspecified: Secondary | ICD-10-CM | POA: Insufficient documentation

## 2018-03-01 NOTE — Assessment & Plan Note (Addendum)
Saw [facility] Dr Randie Heinzain 10/2016 - rec aspirin, statin, stop smoking, rec f/u 1 year and yearly carotid US.  Due for rpt carotid US - will order. He never completed one ordered by [facility]

## 2018-03-01 NOTE — Assessment & Plan Note (Signed)
Poor dentition throughout. He states he has pulled his own teeth. Advised against this. Will Rx amoxicillin course preventatively. Advised f/u with dentist - info provided for local free dental clinics.

## 2018-03-01 NOTE — Assessment & Plan Note (Signed)
Discussed latest UDS results inappropriately positive for klonopin

## 2018-03-01 NOTE — Assessment & Plan Note (Signed)
Tapered himself off celexa, now only taking trazodone for sleep with good effect - refilled today.

## 2018-03-01 NOTE — Assessment & Plan Note (Signed)
Stable period due to regular sun exposure this summer.  Possible psoriatic arthritis, never followed up with dermatology due to financial concerns.  Will monitor for now.

## 2018-03-01 NOTE — Assessment & Plan Note (Addendum)
High risk patient - avoid controlled substances Ibuprofen 800mg  tid refilled, reviewed monitoring for GI distress, monitoring kidney function.

## 2018-03-20 ENCOUNTER — Ambulatory Visit (HOSPITAL_COMMUNITY)
Admission: RE | Admit: 2018-03-20 | Payer: Medicare HMO | Source: Ambulatory Visit | Attending: Family Medicine | Admitting: Family Medicine

## 2018-03-21 ENCOUNTER — Encounter (HOSPITAL_COMMUNITY): Payer: Self-pay

## 2018-04-04 ENCOUNTER — Telehealth: Payer: Self-pay

## 2018-04-04 ENCOUNTER — Ambulatory Visit (HOSPITAL_COMMUNITY)
Admission: RE | Admit: 2018-04-04 | Payer: Medicare HMO | Source: Ambulatory Visit | Attending: Family Medicine | Admitting: Family Medicine

## 2018-04-04 DIAGNOSIS — R0989 Other specified symptoms and signs involving the circulatory and respiratory systems: Secondary | ICD-10-CM

## 2018-04-04 NOTE — Telephone Encounter (Signed)
Called patient to follow up on no show appointments with Dr. Thomas Hoff and appointment for Carotid US x 2. Due to voicemail not been set up I could not leave a message. Per DPR on file we can talk to his mother. Advised patient's mother about missed appointments, she will follow up with patient to see what happened. And they will call us back if need help rescheduling.-Anastasiya V Hopkins, RMA

## 2018-05-07 ENCOUNTER — Other Ambulatory Visit: Payer: Self-pay | Admitting: Family Medicine

## 2018-05-07 NOTE — Telephone Encounter (Signed)
Ibuprofen Last filled:  04/14/18, #60/1 Last OV:  02/26/18, f/u Next OV:  none

## 2018-07-26 ENCOUNTER — Other Ambulatory Visit: Payer: Self-pay | Admitting: Family Medicine

## 2018-08-05 IMAGING — CT CT ANGIO NECK
2 of 8 series · 14 of 46 positions shown, 16 images · IV contrast (ISOVUE 370)
Comparison: None.

CLINICAL DATA: 55 y/o  M; low blood pressure.  Right carotid bruit.

EXAM:
CT ANGIOGRAPHY NECK
TECHNIQUE: Multidetector CT imaging of the neck was performed using the
standard protocol during bolus administration of intravenous
contrast. Multiplanar CT image reconstructions and MIPs were
obtained to evaluate the vascular anatomy. Carotid stenosis
measurements (when applicable) are obtained utilizing NASCET
criteria, using the distal internal carotid diameter as the
denominator.
CONTRAST:  80 cc Osovue-1YY.

[Series 5: cta neck 2.0 i30f 3 · axial · 0.40mm/px · z∈[-280,-50]mm · 11 of 129 slices shown, 13 images]
[im 7/129  soft-tissue]
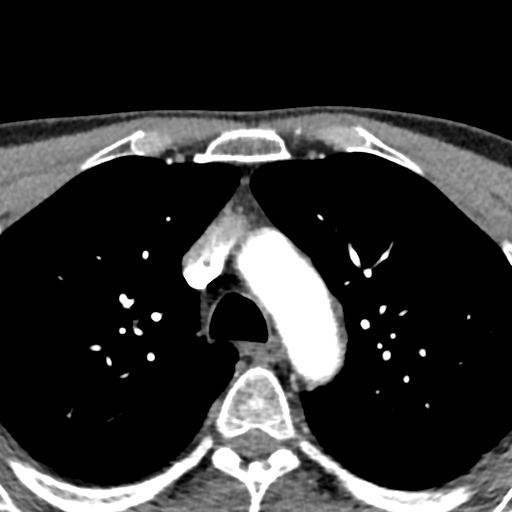
[im 7/129  bone]
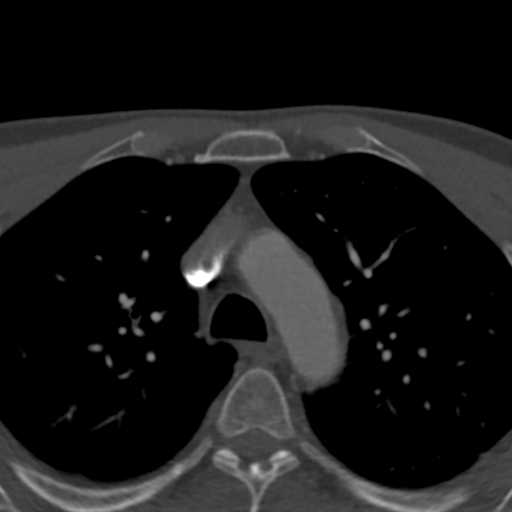
[im 20/129  soft-tissue]
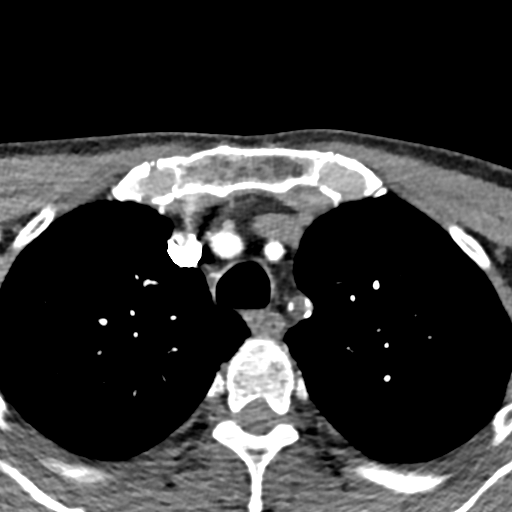
[im 33/129  soft-tissue]
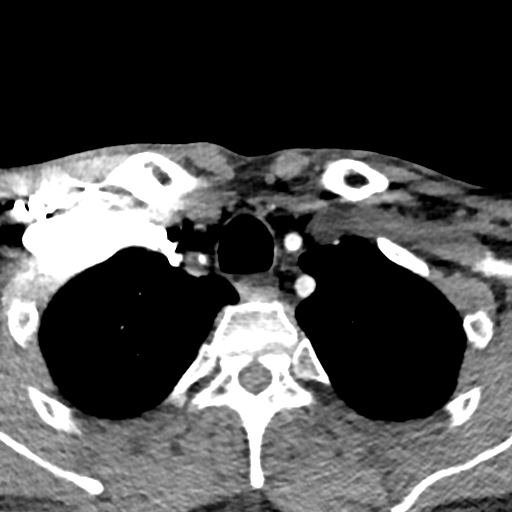
[im 45/129  soft-tissue]
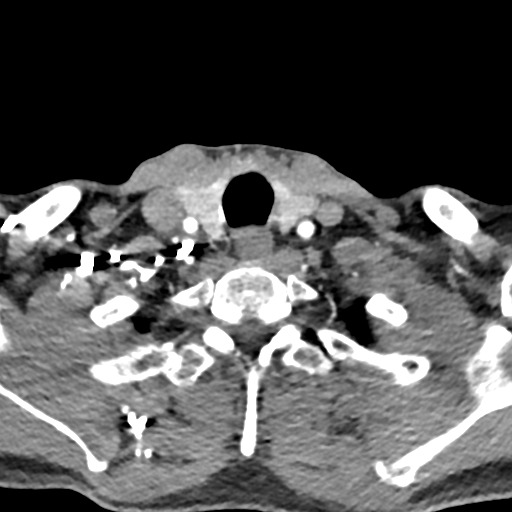
[im 52/129  soft-tissue]
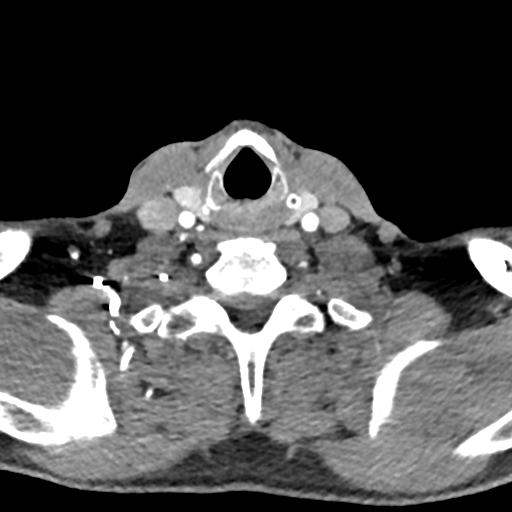
[im 65/129  soft-tissue]
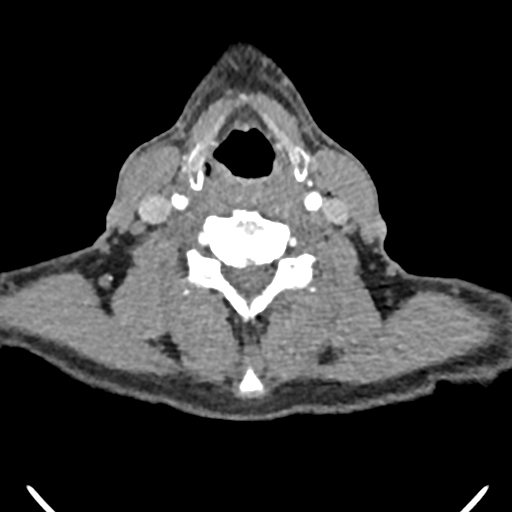
[im 77/129  soft-tissue]
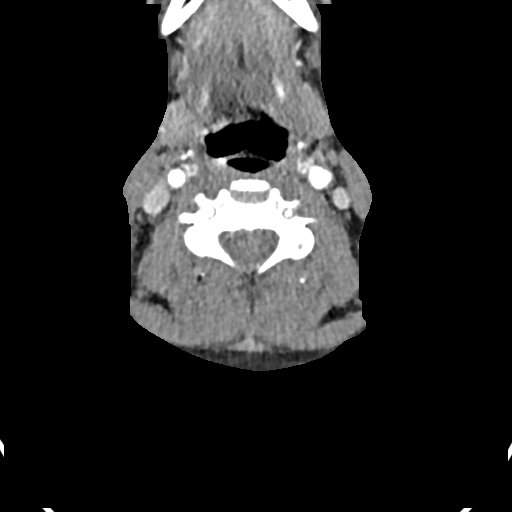
[im 84/129  soft-tissue]
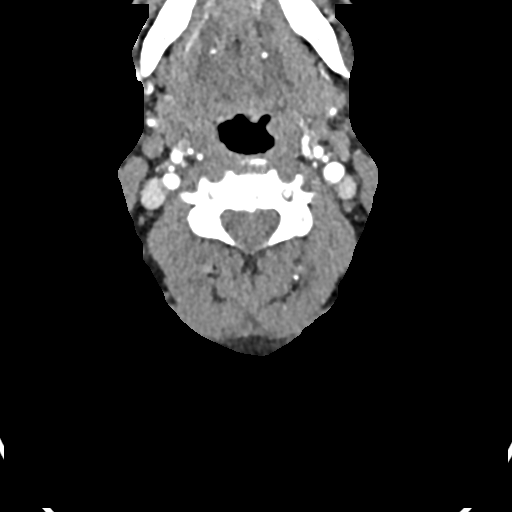
[im 97/129  soft-tissue]
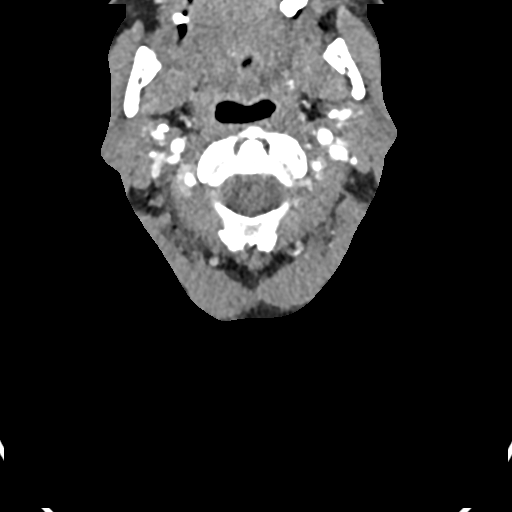
[im 97/129  bone]
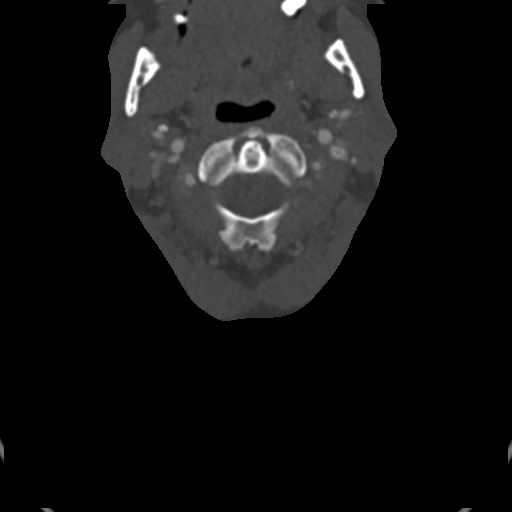
[im 109/129  soft-tissue]
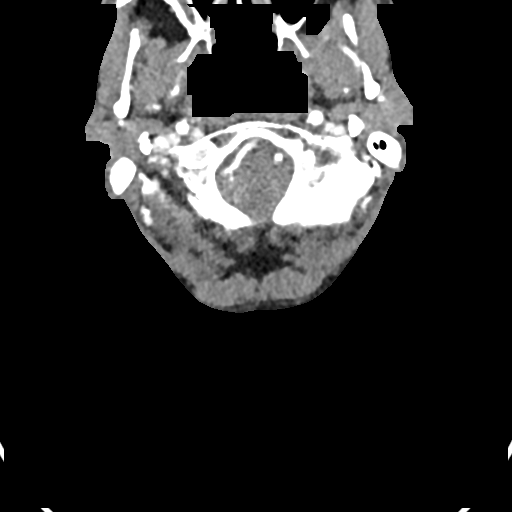
[im 122/129  soft-tissue]
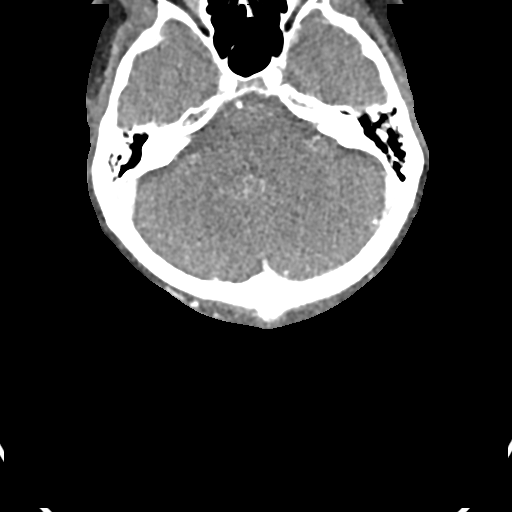

[Series 8: coronal thin · coronal · 0.34mm/px · 3 of 249 slices shown]
[im 71/249  soft-tissue]
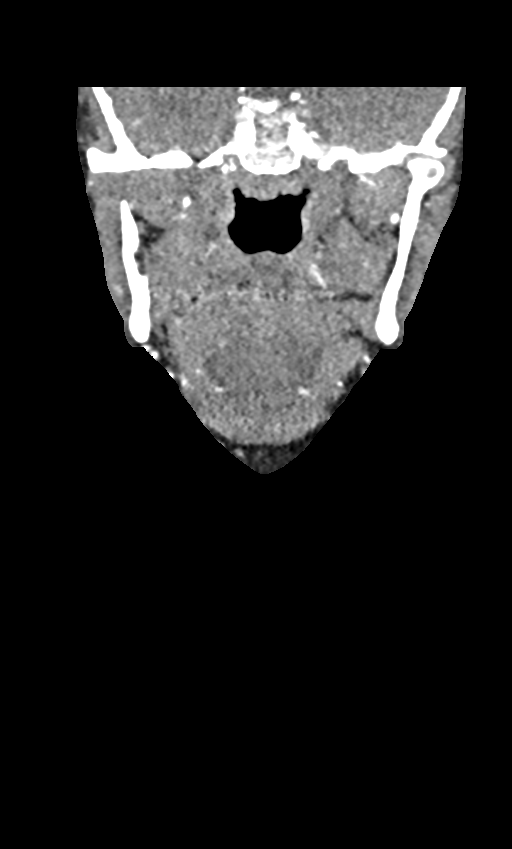
[im 107/249  soft-tissue]
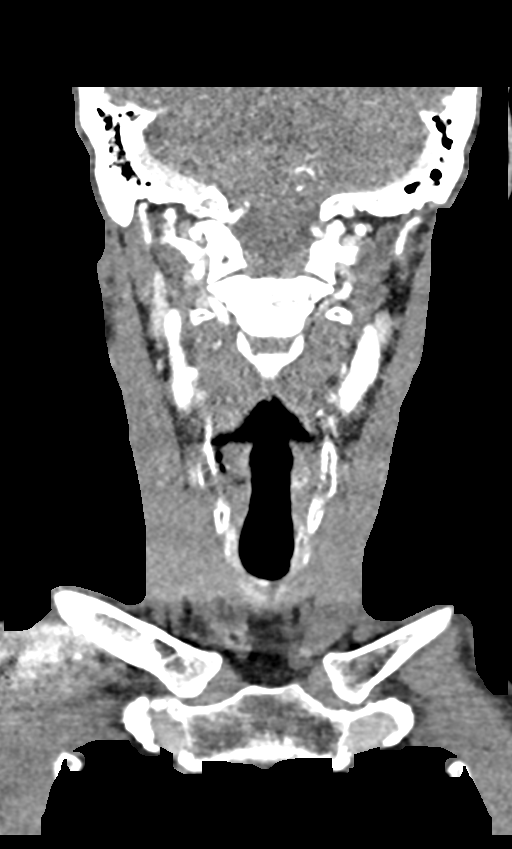
[im 142/249  soft-tissue]
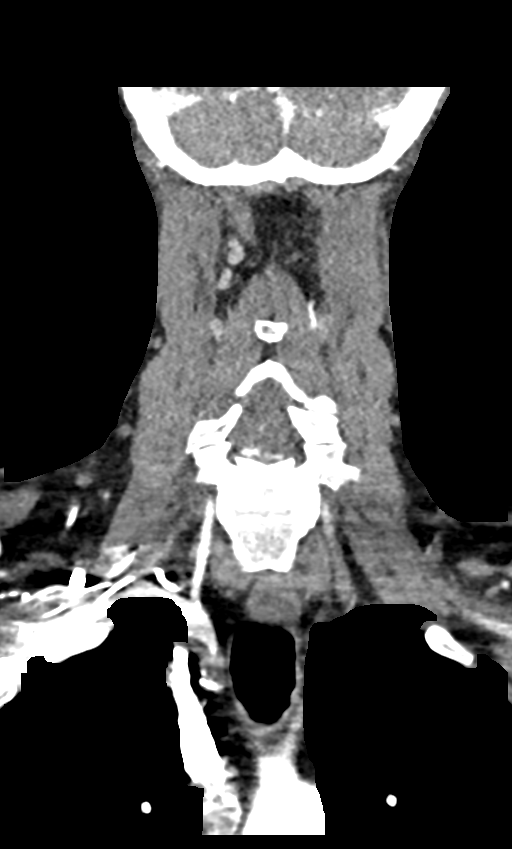

[14 of 46 positions shown; findings below may reference images not displayed]

FINDINGS: Aortic arch: Bovine arch anatomy. No evidence of aneurysm or
dissection. Minimal atherosclerosis with calcifications.
Predominantly fibro fatty plaque of left subclavian artery origin
with severe 80% stenosis.

Right carotid system: No evidence of dissection, stenosis (50% or
greater) or occlusion.Mild fibro fatty plaque of common carotid
artery with minimal stenosis. Mild calcified plaque of the carotid
bifurcation with minimal stenosis.

Left carotid system: No evidence of dissection, stenosis (50% or
greater) or occlusion. Mild fibro fatty plaque of common carotid
artery and carotid bifurcation with minimal stenosis.

Vertebral arteries: Right dominant. Widely patent right vertebral
artery. Long segment of moderate stenosis of the left V1 and
proximal V2.

Skeleton: No acute osseous abnormality. Cervical spondylosis
greatest at the C5 through C7 levels with there is disc space
narrowing and marginal osteophytes. No high-grade bony canal
stenosis. Moderate bony foraminal narrowing at the C5-6 and C6-7
levels.

Other neck: Negative.

Upper chest: Mild centrilobular emphysema.
IMPRESSION: 1. No evidence of dissection, hemodynamically significant stenosis,
or occlusion of the carotid arteries.
2. Mild bilateral common carotid artery fibro fatty plaque, left
carotid bifurcation fibro fatty plaque, and calcified plaque of
right carotid bifurcation.
3. Severe 80% stenosis of left subclavian artery origin secondary to
large fibro fatty plaque.
4. Long segment of moderate stenosis of left V1 and proximal V2
segments is also likely related to atherosclerosis, less likely
chronic dissection.
5. Cervical spondylosis greatest from the C5 through C7 levels. No
high-grade bony canal stenosis.
6. Mild centrilobular emphysema of the lung apices.

By: Donovan Amundson M.D.

## 2018-08-12 ENCOUNTER — Telehealth: Payer: Self-pay | Admitting: Family Medicine

## 2018-08-12 DIAGNOSIS — I771 Stricture of artery: Secondary | ICD-10-CM

## 2018-08-12 NOTE — Telephone Encounter (Signed)
Pt called to set up appt at [facility] to set up appt with Dr Waverly Ferrari but was told he would need a new referral put in since he no showed appt at [facility]- vascular. He is ready to take action his mother, Antoine Poche, sees Dr Edilia Bo.

## 2018-08-14 NOTE — Telephone Encounter (Signed)
Spoke with [facility], they will schedule Carotid and also Dr consult. Called patient and made him aware that [facility] would be calling him.

## 2018-08-14 NOTE — Telephone Encounter (Signed)
Referral placed.  He is also due for carotid US (ordered 02/2018). Wound encourage he complete this prior to visit.

## 2018-09-13 ENCOUNTER — Other Ambulatory Visit: Payer: Self-pay | Admitting: Family Medicine

## 2018-09-30 ENCOUNTER — Other Ambulatory Visit: Payer: Self-pay

## 2018-09-30 ENCOUNTER — Other Ambulatory Visit: Payer: Self-pay | Admitting: Family Medicine

## 2018-09-30 DIAGNOSIS — R0989 Other specified symptoms and signs involving the circulatory and respiratory systems: Secondary | ICD-10-CM

## 2018-09-30 NOTE — Progress Notes (Signed)
carotid 

## 2018-10-10 ENCOUNTER — Encounter: Payer: Medicare HMO | Admitting: Vascular Surgery

## 2018-10-10 ENCOUNTER — Encounter (HOSPITAL_COMMUNITY): Payer: Medicare HMO

## 2018-12-06 ENCOUNTER — Other Ambulatory Visit: Payer: Self-pay | Admitting: Family Medicine

## 2019-02-21 ENCOUNTER — Other Ambulatory Visit: Payer: Self-pay | Admitting: Family Medicine

## 2019-02-21 NOTE — Telephone Encounter (Signed)
LOV 02/26/18  No future appointments have been made

## 2019-09-19 ENCOUNTER — Other Ambulatory Visit: Payer: Self-pay | Admitting: Family Medicine

## 2019-09-22 ENCOUNTER — Other Ambulatory Visit: Payer: Self-pay | Admitting: Family Medicine

## 2019-09-24 MED ORDER — TRAZODONE HCL 100 MG PO TABS: ORAL_TABLET | ORAL | 0 refills | Status: DC

## 2019-09-24 NOTE — Addendum Note (Signed)
Addended by: Nanci Pina on: 09/24/2019 05:23 PM   Modules accepted: Orders

## 2019-09-24 NOTE — Telephone Encounter (Signed)
Noted.  E-scribed trazodone refill.  Fwd encounter to Dr. Reece Agar for ibuprofen refill.

## 2019-09-24 NOTE — Telephone Encounter (Signed)
Patient called to check on his prescriptions for Ibuprofen and Trazodone.  I let patient know he needed an awv scheduled before his medications can be refilled.  Patient scheduled appointments. Can prescriptions be refilled until patient's appointment?

## 2019-09-25 MED ORDER — IBUPROFEN 800 MG PO TABS: 800.0000 mg | ORAL_TABLET | Freq: Two times a day (BID) | ORAL | 1 refills | Status: AC | PRN

## 2019-09-28 ENCOUNTER — Other Ambulatory Visit: Payer: Self-pay | Admitting: Family Medicine

## 2019-09-28 DIAGNOSIS — Z125 Encounter for screening for malignant neoplasm of prostate: Secondary | ICD-10-CM

## 2019-09-28 DIAGNOSIS — E785 Hyperlipidemia, unspecified: Secondary | ICD-10-CM

## 2019-09-30 ENCOUNTER — Telehealth: Payer: Self-pay

## 2019-09-30 ENCOUNTER — Other Ambulatory Visit: Payer: Medicare HMO

## 2019-09-30 ENCOUNTER — Ambulatory Visit: Payer: Medicare HMO

## 2019-09-30 NOTE — Telephone Encounter (Signed)
FYI- Called patient to complete his Medicare Visit and patient requested appointment be cancelled because he is currently sick. He cancelled his labs for today also and asked that someone call him to reschedule this. Sent message to Jason Rodgers to call and reschedule labs for patient.

## 2019-10-06 ENCOUNTER — Encounter: Payer: Medicare HMO | Admitting: Family Medicine

## 2019-10-15 ENCOUNTER — Ambulatory Visit: Payer: Medicare HMO

## 2019-10-15 ENCOUNTER — Telehealth: Payer: Self-pay

## 2019-10-15 NOTE — Telephone Encounter (Signed)
FYI- Called patient 3 times trying to complete his Medicare visit. Patient never answered. Left HIPAA complaint message on voicemail notifying appointment was cancelled and call to reschedule if he desires.

## 2019-10-16 ENCOUNTER — Other Ambulatory Visit: Payer: Medicare HMO

## 2019-10-21 ENCOUNTER — Encounter: Payer: Medicare HMO | Admitting: Family Medicine

## 2019-10-21 DIAGNOSIS — Z0289 Encounter for other administrative examinations: Secondary | ICD-10-CM

## 2019-12-22 ENCOUNTER — Other Ambulatory Visit: Payer: Self-pay | Admitting: Family Medicine
